# Patient Record
Sex: Female | Born: 1955
Health system: Southern US, Community
[De-identification: ages and names within clinical notes are randomized; demographics above are authoritative.]

## PROBLEM LIST (undated history)

## (undated) DIAGNOSIS — K922 Gastrointestinal hemorrhage, unspecified: Secondary | ICD-10-CM

## (undated) DIAGNOSIS — M5030 Other cervical disc degeneration, unspecified cervical region: Secondary | ICD-10-CM

## (undated) DIAGNOSIS — Q761 Klippel-Feil syndrome: Secondary | ICD-10-CM

## (undated) DIAGNOSIS — Q740 Other congenital malformations of upper limb(s), including shoulder girdle: Secondary | ICD-10-CM

## (undated) DIAGNOSIS — B029 Zoster without complications: Secondary | ICD-10-CM

## (undated) DIAGNOSIS — E559 Vitamin D deficiency, unspecified: Secondary | ICD-10-CM

## (undated) DIAGNOSIS — Z8669 Personal history of other diseases of the nervous system and sense organs: Secondary | ICD-10-CM

## (undated) DIAGNOSIS — D649 Anemia, unspecified: Secondary | ICD-10-CM

## (undated) DIAGNOSIS — N8502 Endometrial intraepithelial neoplasia [EIN]: Secondary | ICD-10-CM

## (undated) DIAGNOSIS — IMO0001 Reserved for inherently not codable concepts without codable children: Secondary | ICD-10-CM

## (undated) HISTORY — PX: WISDOM TOOTH EXTRACTION: SHX21

## (undated) HISTORY — DX: Personal history of other diseases of the nervous system and sense organs: Z86.69

## (undated) HISTORY — DX: Gastrointestinal hemorrhage, unspecified: K92.2

## (undated) HISTORY — DX: Other congenital malformations of upper limb(s), including shoulder girdle: Q74.0

## (undated) HISTORY — DX: Klippel-Feil syndrome: Q76.1

## (undated) HISTORY — PX: TONSILLECTOMY: SHX5217

## (undated) HISTORY — DX: Vitamin D deficiency, unspecified: E55.9

## (undated) HISTORY — DX: Endometrial intraepithelial neoplasia (EIN): N85.02

## (undated) HISTORY — DX: Reserved for inherently not codable concepts without codable children: IMO0001

## (undated) HISTORY — DX: Anemia, unspecified: D64.9

## (undated) HISTORY — DX: Zoster without complications: B02.9

## (undated) HISTORY — PX: DILATION AND CURETTAGE OF UTERUS: SHX78

## (undated) HISTORY — DX: Other cervical disc degeneration, unspecified cervical region: M50.30

---

## 1993-02-02 DIAGNOSIS — IMO0001 Reserved for inherently not codable concepts without codable children: Secondary | ICD-10-CM

## 1993-02-02 HISTORY — DX: Reserved for inherently not codable concepts without codable children: IMO0001

## 1998-07-09 ENCOUNTER — Other Ambulatory Visit: Admission: RE | Admit: 1998-07-09 | Discharge: 1998-07-09 | Payer: Self-pay | Admitting: Obstetrics and Gynecology

## 2001-05-12 ENCOUNTER — Other Ambulatory Visit: Admission: RE | Admit: 2001-05-12 | Discharge: 2001-05-12 | Payer: Self-pay | Admitting: Obstetrics and Gynecology

## 2001-06-08 ENCOUNTER — Ambulatory Visit (HOSPITAL_COMMUNITY): Admission: RE | Admit: 2001-06-08 | Discharge: 2001-06-08 | Payer: Self-pay | Admitting: Family Medicine

## 2004-02-03 HISTORY — PX: ABDOMINAL HYSTERECTOMY: SUR658

## 2004-03-17 ENCOUNTER — Ambulatory Visit (HOSPITAL_COMMUNITY): Admission: RE | Admit: 2004-03-17 | Discharge: 2004-03-17 | Payer: Self-pay | Admitting: Family Medicine

## 2004-05-15 ENCOUNTER — Ambulatory Visit (HOSPITAL_COMMUNITY): Admission: RE | Admit: 2004-05-15 | Discharge: 2004-05-15 | Payer: Self-pay | Admitting: Obstetrics and Gynecology

## 2004-07-30 ENCOUNTER — Observation Stay (HOSPITAL_COMMUNITY): Admission: RE | Admit: 2004-07-30 | Discharge: 2004-07-31 | Payer: Self-pay | Admitting: *Deleted

## 2010-12-18 ENCOUNTER — Other Ambulatory Visit: Payer: Self-pay | Admitting: Dermatology

## 2011-03-11 ENCOUNTER — Telehealth: Payer: Self-pay | Admitting: *Deleted

## 2011-03-11 ENCOUNTER — Other Ambulatory Visit: Payer: Self-pay | Admitting: Internal Medicine

## 2011-03-11 DIAGNOSIS — Z1211 Encounter for screening for malignant neoplasm of colon: Secondary | ICD-10-CM

## 2011-03-11 MED ORDER — PEG-KCL-NACL-NASULF-NA ASC-C 100 G PO SOLR: 1.0000 | Freq: Once | ORAL | Status: DC

## 2011-03-11 NOTE — Telephone Encounter (Signed)
Patient came with her mother today for an appointment and she has requested a colonoscopy. Dr Juanda Chance has okayed patient to have a direct recall colonoscopy. I have scheduled patient for colonoscopy and have gone over instructions with her for that.

## 2011-03-18 ENCOUNTER — Ambulatory Visit (AMBULATORY_SURGERY_CENTER): Payer: 59 | Admitting: Internal Medicine

## 2011-03-18 ENCOUNTER — Encounter: Payer: Self-pay | Admitting: Internal Medicine

## 2011-03-18 VITALS — BP 129/79 | HR 78 | Temp 99.1°F | Resp 18 | Ht 64.0 in | Wt 242.0 lb

## 2011-03-18 DIAGNOSIS — Z1211 Encounter for screening for malignant neoplasm of colon: Secondary | ICD-10-CM

## 2011-03-18 MED ORDER — SODIUM CHLORIDE 0.9 % IV SOLN: 500.0000 mL | INTRAVENOUS | Status: DC

## 2011-03-18 NOTE — Op Note (Signed)
[facility] [address] [place], [place]  [address]  COLONOSCOPY PROCEDURE REPORT  PATIENT:  Judith Salazar, Judith Salazar  MR#:  [id number] BIRTHDATE:  01-15-1956, 55 yrs. old  GENDER:  female ENDOSCOPIST:  Hedwig Morton. Juanda Chance, MD REF. BY:  Leda Quail, M.D. PROCEDURE DATE:  03/18/2011 PROCEDURE:  Colonoscopy 19147 ASA CLASS:  Class II INDICATIONS:  colorectal cancer screening, average risk colon 28 years ago for ? IBS MEDICATIONS:   MAC sedation, administered by CRNA, propofol (Diprivan)  DESCRIPTION OF PROCEDURE:   After the risks and benefits and of the procedure were explained, informed consent was obtained. Digital rectal exam was performed and revealed no rectal masses. The [id number] endoscope was introduced through the anus and advanced to the cecum, which was identified by both the appendix and ileocecal valve.  The quality of the prep was good, using MoviPrep.  The instrument was then slowly withdrawn as the colon was fully examined. <<PROCEDUREIMAGES>>  FINDINGS:  No polyps or cancers were seen (see image1, image2, image3, and image4).   Retroflexed views in the rectum revealed no abnormalities.    The scope was then withdrawn from the patient and the procedure completed.  COMPLICATIONS:  None ENDOSCOPIC IMPRESSION: 1) No polyps or cancers 2) Normal colonoscopy RECOMMENDATIONS: 1) High fiber diet.  REPEAT EXAM:  In 10 year(s) for.  ______________________________ Hedwig Morton. Juanda Chance, MD  CC:  n. eSIGNED:   Hedwig Morton. Bradely Rudin at 03/18/2011 12:26 PM  Doughtie, Fannie Knee, [phone]

## 2011-03-18 NOTE — Patient Instructions (Signed)
Please review discharge instructions (blue and green sheets)  Normal colonoscopy- follow up in 10 years  Resume your normal medications

## 2011-03-18 NOTE — Progress Notes (Signed)
Patient did not experience any of the following events: a burn prior to discharge; a fall within the facility; wrong site/side/patient/procedure/implant event; or a hospital transfer or hospital admission upon discharge from the facility. (G8907) Patient did not have preoperative order for IV antibiotic SSI prophylaxis. (G8918)  

## 2011-03-19 ENCOUNTER — Telehealth: Payer: Self-pay | Admitting: *Deleted

## 2011-03-19 NOTE — Telephone Encounter (Signed)
Left message on voicemail on f/u callback.

## 2013-04-09 ENCOUNTER — Other Ambulatory Visit: Payer: Self-pay | Admitting: Obstetrics & Gynecology

## 2013-04-10 NOTE — Telephone Encounter (Signed)
Last filled: AEX 04/06/12 x 1 year  AEX 06/23/12 with Dr. Hyacinth MeekerMiller Last Mammogram: 04/27/12 Bi-Rads 1 (Paper Chart) Estrace Cream #42.5 gm/0refills sent to last patient until AEX

## 2013-06-23 ENCOUNTER — Ambulatory Visit: Payer: Self-pay | Admitting: Obstetrics & Gynecology

## 2013-06-27 ENCOUNTER — Encounter: Payer: Self-pay | Admitting: Obstetrics & Gynecology

## 2013-06-28 ENCOUNTER — Encounter: Payer: Self-pay | Admitting: Obstetrics & Gynecology

## 2013-06-28 ENCOUNTER — Ambulatory Visit (INDEPENDENT_AMBULATORY_CARE_PROVIDER_SITE_OTHER): Payer: BC Managed Care – PPO | Admitting: Obstetrics & Gynecology

## 2013-06-28 VITALS — BP 102/62 | HR 68 | Resp 16 | Ht 62.75 in | Wt 224.2 lb

## 2013-06-28 DIAGNOSIS — Z01419 Encounter for gynecological examination (general) (routine) without abnormal findings: Secondary | ICD-10-CM

## 2013-06-28 DIAGNOSIS — Z124 Encounter for screening for malignant neoplasm of cervix: Secondary | ICD-10-CM

## 2013-06-28 MED ORDER — ESTRADIOL 0.1 MG/GM VA CREA: TOPICAL_CREAM | VAGINAL | Status: DC

## 2013-06-28 MED ORDER — VITAMIN D (ERGOCALCIFEROL) 1.25 MG (50000 UNIT) PO CAPS: 50000.0000 [IU] | ORAL_CAPSULE | ORAL | Status: DC

## 2013-06-28 NOTE — Progress Notes (Signed)
58 y.o. G1P1 UnknownCaucasianF here for annual exam.  Considering moving to [facility].  Very busy at work.  Going to [employer] in [place].  Daughter is doing well.    No vaginal bleeding.  Occasionally has some dryness but this is much better.  Patient's last menstrual period was 02/03/2004.          Sexually active: yes  The current method of family planning is status post hysterectomy.    Exercising: no  not regularly Smoker:  no  Health Maintenance: Pap:  03/11/11 WNL/negative HR HPV History of abnormal Pap:  no MMG:  04/27/12-normal, has appt next week at [facility] Colonoscopy:  3/13-repeat in 10 years, Dr. Juanda Chance BMD:   2013/14-normal TDaP:  2010 Screening Labs: PCP, Hb today: PCP, Urine today: PCP   reports that she has never smoked. She has never used smokeless tobacco. She reports that she drinks alcohol. She reports that she does not use illicit drugs.  Past Medical History  Diagnosis Date  . Hx of migraines   . Shingles   . Madelung's deformity     right wrist  . History of Clostridium difficile 1995    hospitalized x 3 days  . Vitamin D deficiency     Past Surgical History  Procedure Laterality Date  . Abdominal hysterectomy      supra cervix  . Tonsillectomy    . Cesarean section    . Wisdom tooth extraction    . Dilation and curettage of uterus      Current Outpatient Prescriptions  Medication Sig Dispense Refill  . Calcium Carbonate-Vitamin D (CALTRATE 600+D PO) Take by mouth daily.      Tery Sanfilippo Sodium (COLACE PO) Take by mouth as needed.      Marland Kitchen ESTRACE VAGINAL 0.1 MG/GM vaginal cream INSERT 1 GRAM VAGINALLY AT BEDTIME 2 TIMES A WEEK.  42.5 g  0  . Multiple Vitamin (MULTIVITAMIN) tablet Take 1 tablet by mouth daily.      . Vitamin D, Ergocalciferol, (DRISDOL) 50000 UNITS CAPS capsule Take 50,000 Units by mouth every 7 (seven) days.       No current facility-administered medications for this visit.    Family History  Problem Relation Age of  Onset  . Diabetes Maternal Uncle   . Lung cancer Maternal Uncle     mets-renal cell carcinoma  . Skin cancer Mother   . Pancreatic cancer Maternal Grandmother   . Prostate cancer Paternal Grandfather   . Hypertension Father   . Heart attack Father     deceased age 42  . Congestive Heart Failure Maternal Grandfather   . COPD Maternal Grandfather   . Congestive Heart Failure Maternal Uncle     CABG  . Thyroid disease Mother     graves disease-now hypothyroid-treatment with radiation iodine  . Dementia Mother     mild  . Osteoporosis Mother     ROS:  Pertinent items are noted in HPI.  Otherwise, a comprehensive ROS was negative.  Exam:   BP 102/62  Pulse 68  Resp 16  Ht 5' 2.75" (1.594 m)  Wt 224 lb 3.2 oz (101.696 kg)  BMI 40.02 kg/m2  LMP 02/03/2004  Weight change: -3#  Height: 5' 2.75" (159.4 cm)  Ht Readings from Last 3 Encounters:  06/28/13 5' 2.75" (1.594 m)  03/18/11 5\' 4"  (1.626 m)    General appearance: alert, cooperative and appears stated age Head: Normocephalic, without obvious abnormality, atraumatic Neck: no adenopathy, supple, symmetrical, trachea  midline and thyroid normal to inspection and palpation Lungs: clear to auscultation bilaterally Breasts: normal appearance, no masses or tenderness Heart: regular rate and rhythm Abdomen: soft, non-tender; bowel sounds normal; no masses,  no organomegaly Extremities: extremities normal, atraumatic, no cyanosis or edema Skin: Skin color, texture, turgor normal. No rashes or lesions Lymph nodes: Cervical, supraclavicular, and axillary nodes normal. No abnormal inguinal nodes palpated Neurologic: Grossly normal   Pelvic: External genitalia:  no lesions              Urethra:  normal appearing urethra with no masses, tenderness or lesions              Bartholins and Skenes: normal                 Vagina: normal appearing vagina with normal color and discharge, no lesions              Cervix: no lesions               Pap taken: yes Bimanual Exam:  Uterus:  uterus absent              Adnexa: normal adnexa               Rectovaginal: Confirms               Anus:  normal sphincter tone, no lesions  A:  Well Woman with normal exam H/O supracervical TAH with h/o complex endometrial hyperplasia with focal atypia Vaginal atrophic changes, much better with estrace cream  P:   Mammogram scheduled pap smear only today.  Neg HR HPV 2013. Vit D 50K weekly.  #30/2Rf to pharmacy Estrace vaginal cream 1 gram weekly twice to three times weekly return annually or prn  An After Visit Summary was printed and given to the patient.

## 2013-06-28 NOTE — Patient Instructions (Signed)

## 2013-07-03 LAB — IPS PAP SMEAR ONLY

## 2013-07-10 ENCOUNTER — Telehealth: Payer: Self-pay

## 2013-07-10 MED ORDER — FLUCONAZOLE 150 MG PO TABS: ORAL_TABLET | ORAL | Status: DC

## 2013-07-10 NOTE — Telephone Encounter (Signed)
See pap smear results-per Dr Hyacinth Meeker, if patient is having symptoms, ok to send RX//kn

## 2013-10-18 ENCOUNTER — Ambulatory Visit (INDEPENDENT_AMBULATORY_CARE_PROVIDER_SITE_OTHER): Payer: BC Managed Care – PPO | Admitting: Podiatry

## 2013-10-18 ENCOUNTER — Ambulatory Visit (INDEPENDENT_AMBULATORY_CARE_PROVIDER_SITE_OTHER): Payer: BC Managed Care – PPO

## 2013-10-18 ENCOUNTER — Encounter: Payer: Self-pay | Admitting: Podiatry

## 2013-10-18 VITALS — BP 145/72 | HR 72 | Resp 17

## 2013-10-18 DIAGNOSIS — M6740 Ganglion, unspecified site: Secondary | ICD-10-CM

## 2013-10-18 DIAGNOSIS — B351 Tinea unguium: Secondary | ICD-10-CM

## 2013-10-18 DIAGNOSIS — M2010 Hallux valgus (acquired), unspecified foot: Secondary | ICD-10-CM

## 2013-10-18 DIAGNOSIS — R52 Pain, unspecified: Secondary | ICD-10-CM

## 2013-10-18 MED ORDER — TRIAMCINOLONE ACETONIDE 10 MG/ML IJ SUSP
10.0000 mg | Freq: Once | INTRAMUSCULAR | Status: AC
Start: 2013-10-18 — End: 2013-10-18
  Administered 2013-10-18: 10 mg

## 2013-10-18 NOTE — Progress Notes (Signed)
   Subjective:    Patient ID: Judith Salazar, female    DOB: Jan 13, 1956, 58 y.o.   MRN: [id number]  HPI  Pt presents with painful cyst on top of her left foot, great toe area, states that it has worsened and grown since last visit, also wants treatment for bilateral great toe fungus  Review of Systems  All other systems reviewed and are negative.      Objective:   Physical Exam        Assessment & Plan:

## 2013-10-19 NOTE — Progress Notes (Signed)
Subjective:     Patient ID: Judith Salazar, female   DOB: 1955/12/03, 58 y.o.   MRN: [id number]  Foot Pain   patient states that this area around the first metatarsal head left has grown in size and become more red and it is becoming more aggravating for me. Also I'm having some discoloration of the big toenails of both feet that I would like to get rid of if possible   Review of Systems  All other systems reviewed and are negative.      Objective:   Physical Exam  Nursing note and vitals reviewed. Constitutional: She is oriented to person, place, and time.  Cardiovascular: Intact distal pulses.   Musculoskeletal: Normal range of motion.  Neurological: She is oriented to person, place, and time.  Skin: Skin is warm.   neurovascular status is intact with muscle strength adequate and range of motion subtalar midtarsal joint within normal limits. Digits are well perfused arch height is mildly diminished on weightbearing and she is well oriented x3. I noted there to be inflammation with slight fluid buildup around the first metatarsal head left but I did note a sharp point on the bone on the medial dorsal eminence and I also noted discoloration of the big toenails of both feet in the distal one third of the nailbed     Assessment:     Structural hyperostosis around the first metatarsal head left with possibility for either ganglionic cyst or inflamed capsule. Mycotic nail infection of both big toenails    Plan:     H&P and x-rays reviewed. Today I went ahead and I did a proximal nerve block attempted aspiration which was not successful and injected a small amount of steroid around the first MPJ left. This crusted ultimately this may require a very simple surgery to fix and also we discussed her nails and she will have laser on her to big toenails in the near future

## 2013-11-01 ENCOUNTER — Ambulatory Visit: Payer: BC Managed Care – PPO | Admitting: Podiatry

## 2013-11-29 ENCOUNTER — Ambulatory Visit: Payer: BC Managed Care – PPO | Admitting: Podiatry

## 2013-11-29 DIAGNOSIS — B351 Tinea unguium: Secondary | ICD-10-CM

## 2013-11-30 NOTE — Progress Notes (Signed)
Subjective:     Patient ID: Judith Salazar, female   DOB: 1956-01-25, 58 y.o.   MRN: [id number]  HPI patient presents for laser of both big toenails today   Review of Systems     Objective:   Physical Exam Neurovascular status intact with distal thick yellow nails bilateral hallux    Assessment:     Mycotic infection of distal hallux of both feet    Plan:     Laser approximately 1200 pulses was obtained today and tolerated well

## 2013-12-04 ENCOUNTER — Encounter: Payer: Self-pay | Admitting: Podiatry

## 2014-01-10 ENCOUNTER — Ambulatory Visit: Payer: BC Managed Care – PPO | Admitting: Podiatry

## 2014-02-07 ENCOUNTER — Encounter: Payer: Self-pay | Admitting: Podiatry

## 2014-02-07 ENCOUNTER — Ambulatory Visit (INDEPENDENT_AMBULATORY_CARE_PROVIDER_SITE_OTHER): Payer: BLUE CROSS/BLUE SHIELD | Admitting: Podiatry

## 2014-02-07 DIAGNOSIS — B351 Tinea unguium: Secondary | ICD-10-CM

## 2014-02-07 NOTE — Progress Notes (Signed)
Subjective:     Patient ID: Judith Salazar, female   DOB: November 08, 1955, 59 y.o.   MRN: [id number]  HPI patient presents with nail disease hallux bilateral that is improving with laser   Review of Systems     Objective:   Physical Exam Neurovascular status intact with slight yellow discoloration distal one third nailbeds hallux bilateral    Assessment:     Mycotic infection still present but improved    Plan:     Laser applied approximately 1200 pulses to each hallux tolerated well and reappoint in 4 months and also begin formula 3

## 2014-06-13 ENCOUNTER — Ambulatory Visit (INDEPENDENT_AMBULATORY_CARE_PROVIDER_SITE_OTHER): Payer: BLUE CROSS/BLUE SHIELD | Admitting: Podiatry

## 2014-06-13 DIAGNOSIS — B351 Tinea unguium: Secondary | ICD-10-CM

## 2014-06-15 NOTE — Progress Notes (Signed)
Subjective:     Patient ID: Judith Salazar, female   DOB: 1955/06/07, 59 y.o.   MRN: [id number]  HPI patient presents for laser in of nails   Review of Systems     Objective:   Physical Exam Neurovascular status intact with improvement of nailbeds of both feet    Assessment:     Debride nailbeds and then went ahead and utilized laser 1000 1200 pulses    Plan:     Tolerated well

## 2014-07-10 ENCOUNTER — Ambulatory Visit: Payer: Self-pay | Admitting: Obstetrics & Gynecology

## 2014-10-17 ENCOUNTER — Ambulatory Visit (INDEPENDENT_AMBULATORY_CARE_PROVIDER_SITE_OTHER): Payer: BLUE CROSS/BLUE SHIELD | Admitting: Podiatry

## 2014-10-17 DIAGNOSIS — B351 Tinea unguium: Secondary | ICD-10-CM

## 2014-10-17 NOTE — Progress Notes (Signed)
Subjective:     Patient ID: Judith Salazar, female   DOB: Feb 15, 1955, 59 y.o.   MRN: [id number]  HPI patient states I developed some discoloration of my nail secondary to using the polish and my big toenails need laser   Review of Systems     Objective:   Physical Exam Neurovascular status intact with some discoloration of the hallux and lesser nails bilateral    Assessment:     Mycotic nail with reaction to chemical of the medication    Plan:     Stop medication and utilized laser therapy at this time approximate 1500 pulses to the hallux bilateral which was tolerated well

## 2014-11-30 ENCOUNTER — Ambulatory Visit: Payer: Self-pay | Admitting: Obstetrics & Gynecology

## 2015-01-16 ENCOUNTER — Other Ambulatory Visit: Payer: BLUE CROSS/BLUE SHIELD

## 2015-01-16 ENCOUNTER — Ambulatory Visit: Payer: BLUE CROSS/BLUE SHIELD | Admitting: Podiatry

## 2015-02-06 ENCOUNTER — Other Ambulatory Visit: Payer: BLUE CROSS/BLUE SHIELD

## 2015-02-13 ENCOUNTER — Encounter: Payer: Self-pay | Admitting: Podiatry

## 2015-02-13 ENCOUNTER — Ambulatory Visit (INDEPENDENT_AMBULATORY_CARE_PROVIDER_SITE_OTHER): Payer: BLUE CROSS/BLUE SHIELD | Admitting: Podiatry

## 2015-02-13 DIAGNOSIS — B351 Tinea unguium: Secondary | ICD-10-CM

## 2015-02-15 NOTE — Progress Notes (Signed)
Subjective:     Patient ID: Judith Salazar, female   DOB: 12/08/1955, 60 y.o.   MRN: [id number]  HPI patient presents with nail yellow numbness and a small lesion left   Review of Systems     Objective:   Physical Exam Neurovascular status intact mycotic infection left nail improved but present with lesion plantar left    Assessment:     Lesion nail disease    Plan:     Laser administered debridement and nail and reappoint as needed

## 2015-02-22 ENCOUNTER — Ambulatory Visit (INDEPENDENT_AMBULATORY_CARE_PROVIDER_SITE_OTHER): Payer: BLUE CROSS/BLUE SHIELD | Admitting: Obstetrics & Gynecology

## 2015-02-22 ENCOUNTER — Encounter: Payer: Self-pay | Admitting: Obstetrics & Gynecology

## 2015-02-22 VITALS — BP 108/80 | HR 66 | Resp 16 | Ht 63.0 in | Wt 240.0 lb

## 2015-02-22 DIAGNOSIS — Z01419 Encounter for gynecological examination (general) (routine) without abnormal findings: Secondary | ICD-10-CM | POA: Diagnosis not present

## 2015-02-22 DIAGNOSIS — E559 Vitamin D deficiency, unspecified: Secondary | ICD-10-CM | POA: Diagnosis not present

## 2015-02-22 MED ORDER — VITAMIN D (ERGOCALCIFEROL) 1.25 MG (50000 UNIT) PO CAPS: 50000.0000 [IU] | ORAL_CAPSULE | ORAL | Status: DC

## 2015-02-22 MED ORDER — ESTRADIOL 0.1 MG/GM VA CREA: TOPICAL_CREAM | VAGINAL | Status: DC

## 2015-02-22 NOTE — Progress Notes (Signed)
60 y.o. G1P1 UnknownCaucasianF here for annual exam.  Doing well.  No vaginal bleeding.  Reports some stressors with her daughter due to increased intravascular pressure. She has been diagnosed with idiopathic intracranial hypertension.  She is having an MRI/MRA.  Then she will have a lumbar puncture.    PCP:  Dr. Evlyn Kanner.  Labs have been done this past years.   Patient's last menstrual period was 02/03/2004.          Sexually active: Yes.    The current method of family planning is status post hysterectomy.    Exercising: Yes.    Treadmill 3 x week 20 min Smoker:  no  Health Maintenance: Pap:  06/28/13 Neg. 03/2011 Neg HR HPV History of abnormal Pap:  no MMG: 08/15/14 BIRADS1:neg Colonoscopy:  03/18/11 Normal - repeat 10 years  BMD:   07/08/11  TDaP:  2010 Screening Labs: PCP, Hb today: PCP, Urine today: PCP   reports that she has never smoked. She has never used smokeless tobacco. She reports that she drinks alcohol. She reports that she does not use illicit drugs.  Past Medical History  Diagnosis Date  . Hx of migraines   . Shingles 3/10, 11/16  . Madelung's deformity     right wrist  . History of Clostridium difficile 1995    hospitalized x 3 days  . Vitamin D deficiency     Past Surgical History  Procedure Laterality Date  . Abdominal hysterectomy  2006    supra cervix  . Tonsillectomy    . Cesarean section    . Wisdom tooth extraction    . Dilation and curettage of uterus      Current Outpatient Prescriptions  Medication Sig Dispense Refill  . Calcium Carbonate-Vitamin D (CALTRATE 600+D PO) Take by mouth daily.    Tery Sanfilippo Sodium (COLACE PO) Take by mouth as needed.    Marland Kitchen estradiol (ESTRACE VAGINAL) 0.1 MG/GM vaginal cream 1 gram vaginally twice weekly 42.5 g 3  . Multiple Vitamin (MULTIVITAMIN) tablet Take 1 tablet by mouth daily.    . Vitamin D, Ergocalciferol, (DRISDOL) 50000 UNITS CAPS capsule Take 1 capsule (50,000 Units total) by mouth every 7 (seven) days. 30  capsule 2   No current facility-administered medications for this visit.    Family History  Problem Relation Age of Onset  . Diabetes Maternal Uncle   . Lung cancer Maternal Uncle     mets-renal cell carcinoma  . Skin cancer Mother   . Pancreatic cancer Maternal Grandmother   . Prostate cancer Paternal Grandfather   . Hypertension Father   . Heart attack Father     deceased age 75  . Congestive Heart Failure Maternal Grandfather   . COPD Maternal Grandfather   . Congestive Heart Failure Maternal Uncle     CABG  . Thyroid disease Mother     graves disease-now hypothyroid-treatment with radiation iodine  . Dementia Mother     mild  . Osteoporosis Mother     ROS:  Pertinent items are noted in HPI.  Otherwise, a comprehensive ROS was negative.  Exam:   BP 108/80 mmHg  Pulse 66  Resp 16  Ht  (1.6 m)  Wt 240 lb (108.863 kg)  BMI 42.52 kg/m2  LMP 02/03/2004  Weight change: -4#  Height:  (160 cm)  Ht Readings from Last 3 Encounters:  02/22/15  (1.6 m)  06/28/13 5' 2.75" (1.594 m)  03/18/11  (1.626 m)    General  appearance: alert, cooperative and appears stated age Head: Normocephalic, without obvious abnormality, atraumatic Neck: no adenopathy, supple, symmetrical, trachea midline and thyroid normal to inspection and palpation Lungs: clear to auscultation bilaterally Breasts: normal appearance, no masses or tenderness Heart: regular rate and rhythm Abdomen: soft, non-tender; bowel sounds normal; no masses,  no organomegaly Extremities: extremities normal, atraumatic, no cyanosis or edema Skin: Skin color, texture, turgor normal. No rashes or lesions Lymph nodes: Cervical, supraclavicular, and axillary nodes normal. No abnormal inguinal nodes palpated Neurologic: Grossly normal   Pelvic: External genitalia:  no lesions              Urethra:  normal appearing urethra with no masses, tenderness or lesions              Bartholins and Skenes: normal                  Vagina: normal appearing vagina with normal color and discharge, no lesions              Cervix: absent              Pap taken: No. Bimanual Exam:  Uterus:  uterus absent              Adnexa: no mass, fullness, tenderness               Rectovaginal: Confirms               Anus:  normal sphincter tone, no lesions  Chaperone was present for exam.  A:  Well Woman with normal exam H/O supracervical TAH with h/o complex endometrial hyperplasia with focal atypia Vaginal atrophic changes, much better with estrace cream  P: Mammogram scheduled No pap today.  Neg pap 2015.  Neg HR HPV 2013. Vit D 50K weekly. #30/2RF to pharmacy Estrace vaginal cream 1 gram weekly twice to three times weekly TSH, Vit D, lipid, CMP, CBC, Hep C Ab--order given to pt return annually or prn

## 2015-02-26 ENCOUNTER — Ambulatory Visit: Payer: Self-pay | Admitting: Obstetrics & Gynecology

## 2015-03-12 ENCOUNTER — Ambulatory Visit: Payer: Self-pay | Admitting: Obstetrics & Gynecology

## 2015-05-24 DIAGNOSIS — R300 Dysuria: Secondary | ICD-10-CM | POA: Diagnosis not present

## 2015-07-31 DIAGNOSIS — D225 Melanocytic nevi of trunk: Secondary | ICD-10-CM | POA: Diagnosis not present

## 2015-07-31 DIAGNOSIS — L821 Other seborrheic keratosis: Secondary | ICD-10-CM | POA: Diagnosis not present

## 2015-07-31 DIAGNOSIS — L814 Other melanin hyperpigmentation: Secondary | ICD-10-CM | POA: Diagnosis not present

## 2015-07-31 DIAGNOSIS — L819 Disorder of pigmentation, unspecified: Secondary | ICD-10-CM | POA: Diagnosis not present

## 2015-08-21 DIAGNOSIS — Z1231 Encounter for screening mammogram for malignant neoplasm of breast: Secondary | ICD-10-CM | POA: Diagnosis not present

## 2015-09-04 DIAGNOSIS — H524 Presbyopia: Secondary | ICD-10-CM | POA: Diagnosis not present

## 2015-09-05 ENCOUNTER — Encounter: Payer: Self-pay | Admitting: Obstetrics & Gynecology

## 2015-10-10 DIAGNOSIS — Z23 Encounter for immunization: Secondary | ICD-10-CM | POA: Diagnosis not present

## 2016-01-21 DIAGNOSIS — K922 Gastrointestinal hemorrhage, unspecified: Secondary | ICD-10-CM

## 2016-01-21 HISTORY — DX: Gastrointestinal hemorrhage, unspecified: K92.2

## 2016-01-28 DIAGNOSIS — K921 Melena: Secondary | ICD-10-CM | POA: Diagnosis not present

## 2016-02-05 ENCOUNTER — Telehealth: Payer: Self-pay | Admitting: Internal Medicine

## 2016-02-05 NOTE — Telephone Encounter (Signed)
Returned [employer] [employer]'s call and scheduled with Dr. Marina GoodellPerry for 03-13-16.  She said she will notify patient of appt

## 2016-02-05 NOTE — Telephone Encounter (Signed)
I am happy to see her. Thanks

## 2016-02-17 NOTE — Telephone Encounter (Signed)
Pt is scheduled on 03-13-16

## 2016-02-21 DIAGNOSIS — K921 Melena: Secondary | ICD-10-CM | POA: Diagnosis not present

## 2016-03-13 ENCOUNTER — Encounter: Payer: Self-pay | Admitting: Internal Medicine

## 2016-03-13 ENCOUNTER — Ambulatory Visit (INDEPENDENT_AMBULATORY_CARE_PROVIDER_SITE_OTHER): Payer: BLUE CROSS/BLUE SHIELD | Admitting: Internal Medicine

## 2016-03-13 VITALS — BP 124/72 | HR 82 | Ht 63.0 in | Wt 253.0 lb

## 2016-03-13 DIAGNOSIS — D509 Iron deficiency anemia, unspecified: Secondary | ICD-10-CM | POA: Diagnosis not present

## 2016-03-13 DIAGNOSIS — K920 Hematemesis: Secondary | ICD-10-CM

## 2016-03-13 DIAGNOSIS — K921 Melena: Secondary | ICD-10-CM | POA: Diagnosis not present

## 2016-03-13 MED ORDER — NA SULFATE-K SULFATE-MG SULF 17.5-3.13-1.6 GM/177ML PO SOLN: 1.0000 | Freq: Once | ORAL | 0 refills | Status: AC

## 2016-03-13 NOTE — Patient Instructions (Signed)

## 2016-03-13 NOTE — Progress Notes (Signed)
HISTORY OF PRESENT ILLNESS:  Judith Salazar is a pleasant 61 y.o. 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 Evlyn Kanner with chief complaints of hematemesis, melena, and anemia. The patient's GI history is remarkable for problems with post antibiotic Clostridium difficile 1996 and undefined upper abdominal pain 2016. Evaluations of the abdominal pain in late July 2016 included normal right upper quadrant ultrasound, contrast-enhanced CT scan of the abdomen and pelvis, and laboratories including comprehensive metabolic panel, CBC, amylase, and lipase. Hemoglobin at that time was 13.2. She tells me that she was placed on PPI for approximate 4 weeks and her symptoms resolved without recurrence. She remained in her usual state of health until late December 2017 when while visiting [place] with her family (to celebrate her 60th birthday) developed a presyncopal spell while bending over. This was followed later that evening by recurrent nausea and eventual coffee-ground emesis. Over the next 8 days she noticed dark tarry stools consistent with melena. She did not seek immediate medical attention. Repeat blood counts in [facility] 01/28/2016 showed interval anemia with a hemoglobin of 10.4. Normal MCV 87.8. Iron studies revealed iron deficiency with a saturation of 12% and ferritin of 9.7. B12 was borderline at 278 (232). She was placed on daily PPI, B12, and iron. Follow-up hemoglobin 02/21/2016 was 11.4. She completed 1 month of PPI approximately 1 week ago. She has had no problems or recurrent symptoms since the first of this year. She did have routine screening colonoscopy with Dr. Juanda Chance in February 2013. Examination was normal. Routine follow-up colonoscopy in 10 years was anticipated. GI review of systems is otherwise negative. She does not donate blood and is status post remote hysterectomy  REVIEW OF SYSTEMS:  All non-GI ROS negative upon comprehensive review  Past  Medical History:  Diagnosis Date  . Anemia   . History of Clostridium difficile 1995   hospitalized x 3 days  . Hx of migraines   . Madelung's deformity    right wrist  . Shingles 3/10, 11/16  . Vitamin D deficiency     Past Surgical History:  Procedure Laterality Date  . ABDOMINAL HYSTERECTOMY  2006   supra cervix  . CESAREAN SECTION    . DILATION AND CURETTAGE OF UTERUS    . TONSILLECTOMY    . WISDOM TOOTH EXTRACTION      Social History Judith Salazar  reports that she has never smoked. She has never used smokeless tobacco. She reports that she drinks alcohol. She reports that she does not use drugs.  family history includes COPD in her maternal grandfather; Congestive Heart Failure in her maternal grandfather and maternal uncle; Dementia in her mother; Diabetes in her maternal uncle; Heart attack in her father; Hypertension in her father; Lung cancer in her maternal uncle; Osteoporosis in her mother; Pancreatic cancer in her maternal grandmother; Prostate cancer in her paternal grandfather; Skin cancer in her mother; Thyroid disease in her mother.  No Known Allergies     PHYSICAL EXAMINATION: Vital signs: BP 124/72   Pulse 82   Ht 5\' 3"  (1.6 m)   Wt 253 lb (114.8 kg)   LMP 02/03/2004 Comment: supracx  BMI 44.82 kg/m   Constitutional: Pleasant, generally well-appearing, no acute distress Psychiatric: alert and oriented x3, cooperative Eyes: extraocular movements intact, anicteric, conjunctiva pink Mouth: oral pharynx moist, no lesions Neck: supple no lymphadenopathy Cardiovascular: heart regular rate and rhythm, no murmur Lungs: clear to auscultation bilaterally Abdomen: soft, nontender, nondistended, no  obvious ascites, no peritoneal signs, normal bowel sounds, no organomegaly Rectal: Deferred until colonoscopy Extremities: no clubbing cyanosis or lower extremity edema bilaterally Skin: no lesions on visible extremities Neuro: No focal deficits.    ASSESSMENT:  #1. Upper GI bleed. Possible etiologies include peptic ulcer disease, Mallory-Weiss tear, vascular malformations, Dieulafoy lesion. No recurrent problems after the initial episode. #2. Normocytic anemia. Definite iron deficiency. Borderline B12 deficiency. Improved hemoglobin on iron and B12 #3. Prior history of upper abdominal pain as described. Negative workup. Improvement after course of PPI. #4. History of severe Clostridium difficile infection post antibiotic therapy, remotely. Required hospitalization. #5. Normal colonoscopy 5 years ago  PLAN:  #1. Upper endoscopy to evaluate upper GI bleeding. Also, duodenal biopsies to rule out sprue if endoscopic survey negative for mucosal abnormalities.The nature of the procedure, as well as the risks, benefits, and alternatives were carefully and thoroughly reviewed with the patient. Ample time for discussion and questions allowed. The patient understood, was satisfied, and agreed to proceed. #2. As it has been 5 years, would also plan colonoscopy given iron deficiency, which would not be expected with acute GI bleeding, to rule interval occult colonic lesion.The nature of the procedure, as well as the risks, benefits, and alternatives were carefully and thoroughly reviewed with the patient. Ample time for discussion and questions allowed. The patient understood, was satisfied, and agreed to proceed. #3. Resume PPI empirically. She agrees #4. Continue iron and B12, but hold iron 1 week prior colonoscopy to assist with preparation. Advised #5. If no pathology found on endoscopic examinations, would recommend monitoring her blood counts on iron and B12. This can be done by Dr. Evlyn [place]. If her counts normalize then I would continue B12 and stop iron. If she were to develop any evidence of recurrent GI bleeding and/or recurrent anemia off iron, then I would recommend capsule endoscopy to rule out small bowel lesion. We discussed this today.  A  copy of this consultation note has been sent to Dr. Evlyn [place]anner[place]

## 2016-05-27 ENCOUNTER — Encounter: Payer: BLUE CROSS/BLUE SHIELD | Admitting: Internal Medicine

## 2016-06-11 NOTE — Progress Notes (Signed)
61 y.o. G1P1 MarriedCaucasianF here for annual exam.  Doing well.  Daughter has moved back to [place].  She will be home this weekend.  Biggest issue was a GI bleed in December when [place] was in [place] a [place] trip.  Had coffee ground emesis and dark stools.  This lasted for five days.  Endoscopy and colonoscopy has been scheduled.  Has been doing hemoglobin levels at work.  This has been gradually improving.  On Dexilant not.  Last hemoglobin was 11.7.    Patient's last menstrual period was 02/03/2004.          Sexually active: Yes.    The current method of family planning is status post hysterectomy.    Exercising: No.  The patient does not participate in regular exercise at present. Smoker:  no  Health Maintenance: Pap:  06/28/13 Neg   03/2011 Neg. HR HPV:neg  History of abnormal Pap:  no MMG:  08/21/15 BIRADS1:neg  Colonoscopy:  03/18/11 Normal. f/u 10 years. Endo and colonoscopy scheduled for 08/2016 due to GI Bleed.   BMD:   07/08/11  TDaP:  2010  Pneumonia vaccine(s):  No Zostavax:   No.  Has had shingles twice. Hep C testing: will plan with lab work today Screening Labs: PCP   reports that she has never smoked. She has never used smokeless tobacco. She reports that she drinks alcohol. She reports that she does not use drugs.  Past Medical History:  Diagnosis Date  . Anemia   . GI bleed 01/21/2016  . History of Clostridium difficile 1995   hospitalized x 3 days  . Hx of migraines   . Madelung's deformity    right wrist  . Shingles 3/10, 11/16  . Vitamin D deficiency     Past Surgical History:  Procedure Laterality Date  . ABDOMINAL HYSTERECTOMY  2006   supra cervix  . CESAREAN SECTION    . DILATION AND CURETTAGE OF UTERUS    . TONSILLECTOMY    . WISDOM TOOTH EXTRACTION      Current Outpatient Prescriptions  Medication Sig Dispense Refill  . Calcium Carbonate-Vitamin D (CALTRATE 600+D PO) Take by mouth daily.    Marland Kitchen. Dexlansoprazole (DEXILANT) 30 MG capsule Take 30 mg  by mouth daily.    Marland Kitchen. estradiol (ESTRACE VAGINAL) 0.1 MG/GM vaginal cream 1 gram vaginally twice weekly 42.5 g 3  . Fe Cbn-Fe Gluc-FA-B12-C-DSS (FERRALET 90 PO) Take 1 tablet by mouth daily.    . Multiple Vitamin (MULTIVITAMIN) tablet Take 1 tablet by mouth daily.    . vitamin B-12 (CYANOCOBALAMIN) 1000 MCG tablet Take 1,000 mcg by mouth daily.    . Vitamin D, Ergocalciferol, (DRISDOL) 50000 units CAPS capsule Take 1 capsule (50,000 Units total) by mouth every 7 (seven) days. 12 capsule 4   No current facility-administered medications for this visit.     Family History  Problem Relation Age of Onset  . Diabetes Maternal Uncle   . Lung cancer Maternal Uncle        mets-renal cell carcinoma  . Skin cancer Mother   . Thyroid disease Mother        graves disease-now hypothyroid-treatment with radiation iodine  . Dementia Mother        mild  . Osteoporosis Mother   . Pancreatic cancer Maternal Grandmother   . Prostate cancer Paternal Grandfather   . Hypertension Father   . Heart attack Father        deceased age 61  . Congestive Heart  Failure Maternal Grandfather   . COPD Maternal Grandfather   . Congestive Heart Failure Maternal Uncle        CABG    ROS:  Pertinent items are noted in HPI.  Otherwise, a comprehensive ROS was negative.  Exam:   BP 130/82 (BP Location: Left Arm, Patient Position: Sitting, Cuff Size: Large)   Pulse 64   Resp 16   Ht 5' 2.5" (1.588 m)   Wt 254 lb (115.2 kg)   LMP 02/03/2004 Comment: supracx  BMI 45.72 kg/m   Weight change: +14#  Height: 5' 2.5" (158.8 cm)  Ht Readings from Last 3 Encounters:  06/12/16 5' 2.5" (1.588 m)  03/13/16 5\' 3"  (1.6 m)  02/22/15 5\' 3"  (1.6 m)   General appearance: alert, cooperative and appears stated age Head: Normocephalic, without obvious abnormality, atraumatic Neck: no adenopathy, supple, symmetrical, trachea midline and thyroid normal to inspection and palpation Lungs: clear to auscultation bilaterally Breasts:  normal appearance, no masses or tenderness Heart: regular rate and rhythm Abdomen: soft, non-tender; bowel sounds normal; no masses,  no organomegaly Extremities: extremities normal, atraumatic, no cyanosis or edema Skin: Skin color, texture, turgor normal. No rashes or lesions Lymph nodes: Cervical, supraclavicular, and axillary nodes normal. No abnormal inguinal nodes palpated Neurologic: Grossly normal   Pelvic: External genitalia:  no lesions              Urethra:  normal appearing urethra with no masses, tenderness or lesions              Bartholins and Skenes: normal                 Vagina: normal appearing vagina with normal color and discharge, no lesions              Cervix: absent              Pap taken: Yes.   Bimanual Exam:  Uterus:  normal size, contour, position, consistency, mobility, non-tender              Adnexa: normal adnexa and no mass, fullness, tenderness               Rectovaginal: Confirms               Anus:  normal sphincter tone, no lesions  Chaperone was present for exam.  A:  Well Woman with normal exam H/O supracervical TAH with h/o complex endomerial hyperplasia with focal atypia Vaginal atrophic changes much improved with estrace cream Recent GI bleed with associated anemia  P:   Mammogram guidelines reviewed pap smear and HR HPV obtained today Rx for Vit D 50K weekly .  #12/4RF Estrace vaginal cream 1gm pv twice weekly.  Rx to pharmacy. Rx for lab work given to pt:  Hep C antibody, CBC, Vit D Will plan to have dexa scan this year Return annually or prn

## 2016-06-12 ENCOUNTER — Encounter: Payer: Self-pay | Admitting: Obstetrics & Gynecology

## 2016-06-12 ENCOUNTER — Other Ambulatory Visit (HOSPITAL_COMMUNITY)
Admission: RE | Admit: 2016-06-12 | Discharge: 2016-06-12 | Disposition: A | Discharge status: Home / Self Care | Payer: BLUE CROSS/BLUE SHIELD | Source: Ambulatory Visit | Attending: Obstetrics & Gynecology | Admitting: Obstetrics & Gynecology

## 2016-06-12 ENCOUNTER — Ambulatory Visit (INDEPENDENT_AMBULATORY_CARE_PROVIDER_SITE_OTHER): Payer: BLUE CROSS/BLUE SHIELD | Admitting: Obstetrics & Gynecology

## 2016-06-12 VITALS — BP 130/82 | HR 64 | Resp 16 | Ht 62.5 in | Wt 254.0 lb

## 2016-06-12 DIAGNOSIS — E559 Vitamin D deficiency, unspecified: Secondary | ICD-10-CM | POA: Diagnosis not present

## 2016-06-12 DIAGNOSIS — Z01419 Encounter for gynecological examination (general) (routine) without abnormal findings: Secondary | ICD-10-CM | POA: Diagnosis not present

## 2016-06-12 DIAGNOSIS — Z124 Encounter for screening for malignant neoplasm of cervix: Secondary | ICD-10-CM

## 2016-06-12 DIAGNOSIS — K922 Gastrointestinal hemorrhage, unspecified: Secondary | ICD-10-CM | POA: Diagnosis not present

## 2016-06-12 DIAGNOSIS — Z205 Contact with and (suspected) exposure to viral hepatitis: Secondary | ICD-10-CM | POA: Diagnosis not present

## 2016-06-12 LAB — HM HEPATITIS C SCREENING LAB: HM Hepatitis Screen: NEGATIVE

## 2016-06-12 MED ORDER — ESTRADIOL 0.1 MG/GM VA CREA: TOPICAL_CREAM | VAGINAL | 3 refills | Status: DC

## 2016-06-12 MED ORDER — VITAMIN D (ERGOCALCIFEROL) 1.25 MG (50000 UNIT) PO CAPS: 50000.0000 [IU] | ORAL_CAPSULE | ORAL | 4 refills | Status: DC

## 2016-06-16 LAB — CYTOLOGY - PAP
Diagnosis: NEGATIVE
HPV: NOT DETECTED

## 2016-09-02 DIAGNOSIS — Z1231 Encounter for screening mammogram for malignant neoplasm of breast: Secondary | ICD-10-CM | POA: Diagnosis not present

## 2016-09-09 DIAGNOSIS — H01021 Squamous blepharitis right upper eyelid: Secondary | ICD-10-CM | POA: Diagnosis not present

## 2016-09-09 DIAGNOSIS — H04123 Dry eye syndrome of bilateral lacrimal glands: Secondary | ICD-10-CM | POA: Diagnosis not present

## 2016-09-09 DIAGNOSIS — D2262 Melanocytic nevi of left upper limb, including shoulder: Secondary | ICD-10-CM | POA: Diagnosis not present

## 2016-09-09 DIAGNOSIS — H01022 Squamous blepharitis right lower eyelid: Secondary | ICD-10-CM | POA: Diagnosis not present

## 2016-09-09 DIAGNOSIS — H47323 Drusen of optic disc, bilateral: Secondary | ICD-10-CM | POA: Diagnosis not present

## 2016-09-09 DIAGNOSIS — D2261 Melanocytic nevi of right upper limb, including shoulder: Secondary | ICD-10-CM | POA: Diagnosis not present

## 2016-09-09 DIAGNOSIS — L720 Epidermal cyst: Secondary | ICD-10-CM | POA: Diagnosis not present

## 2016-09-09 DIAGNOSIS — D1801 Hemangioma of skin and subcutaneous tissue: Secondary | ICD-10-CM | POA: Diagnosis not present

## 2016-10-19 ENCOUNTER — Other Ambulatory Visit: Payer: Self-pay | Admitting: Endocrinology

## 2016-10-19 DIAGNOSIS — M5010 Cervical disc disorder with radiculopathy, unspecified cervical region: Secondary | ICD-10-CM

## 2016-10-21 ENCOUNTER — Ambulatory Visit
Admission: RE | Admit: 2016-10-21 | Discharge: 2016-10-21 | Disposition: A | Discharge status: Home / Self Care | Payer: BLUE CROSS/BLUE SHIELD | Source: Ambulatory Visit | Attending: Endocrinology | Admitting: Endocrinology

## 2016-10-21 DIAGNOSIS — M5010 Cervical disc disorder with radiculopathy, unspecified cervical region: Secondary | ICD-10-CM

## 2016-10-21 DIAGNOSIS — M50222 Other cervical disc displacement at C5-C6 level: Secondary | ICD-10-CM | POA: Diagnosis not present

## 2016-10-28 ENCOUNTER — Other Ambulatory Visit: Payer: BLUE CROSS/BLUE SHIELD

## 2016-11-02 ENCOUNTER — Encounter: Payer: Self-pay | Admitting: Obstetrics & Gynecology

## 2016-11-04 DIAGNOSIS — Z23 Encounter for immunization: Secondary | ICD-10-CM | POA: Diagnosis not present

## 2016-11-30 DIAGNOSIS — H5213 Myopia, bilateral: Secondary | ICD-10-CM | POA: Diagnosis not present

## 2017-01-21 ENCOUNTER — Ambulatory Visit (INDEPENDENT_AMBULATORY_CARE_PROVIDER_SITE_OTHER): Payer: BLUE CROSS/BLUE SHIELD | Admitting: Podiatry

## 2017-01-21 ENCOUNTER — Encounter: Payer: Self-pay | Admitting: Podiatry

## 2017-01-21 DIAGNOSIS — L600 Ingrowing nail: Secondary | ICD-10-CM | POA: Diagnosis not present

## 2017-01-21 DIAGNOSIS — B351 Tinea unguium: Secondary | ICD-10-CM

## 2017-01-21 NOTE — Progress Notes (Signed)
Subjective:   Patient ID: Judith LarsenSue E Salazar, female   DOB: 61 y.o.   MRN: [id number]   HPI Patient presents stating she damaged her left big toenail and it has been sore loose and has been discolored and it occurred several months ago   ROS      Objective:  Physical Exam  Neurovascular status unchanged with patient found to have a damaged left hallux nail was loose with bloody infiltrate plantar with no proximal edema erythema noted     Assessment:  Damaged left hallux nail that is dystrophic painful when palpated     Plan:  Reviewed condition and recommended nail removal flushing the wound and applying sterile dressing.  This was accomplished after the toe was anesthetized 60 mg like Marcaine mixture and patient tolerated the procedure well

## 2017-01-21 NOTE — Patient Instructions (Signed)

## 2017-01-22 ENCOUNTER — Ambulatory Visit: Payer: BLUE CROSS/BLUE SHIELD | Admitting: Podiatry

## 2017-02-25 DIAGNOSIS — J329 Chronic sinusitis, unspecified: Secondary | ICD-10-CM | POA: Diagnosis not present

## 2017-04-20 DIAGNOSIS — M7918 Myalgia, other site: Secondary | ICD-10-CM | POA: Diagnosis not present

## 2017-04-20 DIAGNOSIS — E559 Vitamin D deficiency, unspecified: Secondary | ICD-10-CM | POA: Diagnosis not present

## 2017-04-20 DIAGNOSIS — K922 Gastrointestinal hemorrhage, unspecified: Secondary | ICD-10-CM | POA: Diagnosis not present

## 2017-04-20 DIAGNOSIS — M5010 Cervical disc disorder with radiculopathy, unspecified cervical region: Secondary | ICD-10-CM | POA: Diagnosis not present

## 2017-05-17 DIAGNOSIS — M545 Low back pain: Secondary | ICD-10-CM | POA: Diagnosis not present

## 2017-05-17 DIAGNOSIS — M546 Pain in thoracic spine: Secondary | ICD-10-CM | POA: Diagnosis not present

## 2017-05-17 DIAGNOSIS — M4304 Spondylolysis, thoracic region: Secondary | ICD-10-CM | POA: Diagnosis not present

## 2017-05-18 DIAGNOSIS — M546 Pain in thoracic spine: Secondary | ICD-10-CM | POA: Diagnosis not present

## 2017-05-18 DIAGNOSIS — M545 Low back pain: Secondary | ICD-10-CM | POA: Diagnosis not present

## 2017-06-07 DIAGNOSIS — M546 Pain in thoracic spine: Secondary | ICD-10-CM | POA: Diagnosis not present

## 2017-06-07 DIAGNOSIS — M5010 Cervical disc disorder with radiculopathy, unspecified cervical region: Secondary | ICD-10-CM | POA: Diagnosis not present

## 2017-06-07 DIAGNOSIS — M545 Low back pain: Secondary | ICD-10-CM | POA: Diagnosis not present

## 2017-06-07 DIAGNOSIS — M4304 Spondylolysis, thoracic region: Secondary | ICD-10-CM | POA: Diagnosis not present

## 2017-07-12 DIAGNOSIS — M5010 Cervical disc disorder with radiculopathy, unspecified cervical region: Secondary | ICD-10-CM | POA: Diagnosis not present

## 2017-07-12 DIAGNOSIS — M545 Low back pain: Secondary | ICD-10-CM | POA: Diagnosis not present

## 2017-07-12 DIAGNOSIS — M4304 Spondylolysis, thoracic region: Secondary | ICD-10-CM | POA: Diagnosis not present

## 2017-07-12 DIAGNOSIS — M546 Pain in thoracic spine: Secondary | ICD-10-CM | POA: Diagnosis not present

## 2017-07-19 DIAGNOSIS — M5010 Cervical disc disorder with radiculopathy, unspecified cervical region: Secondary | ICD-10-CM | POA: Diagnosis not present

## 2017-07-19 DIAGNOSIS — M545 Low back pain: Secondary | ICD-10-CM | POA: Diagnosis not present

## 2017-07-19 DIAGNOSIS — M546 Pain in thoracic spine: Secondary | ICD-10-CM | POA: Diagnosis not present

## 2017-07-19 DIAGNOSIS — M4304 Spondylolysis, thoracic region: Secondary | ICD-10-CM | POA: Diagnosis not present

## 2017-07-26 DIAGNOSIS — M4304 Spondylolysis, thoracic region: Secondary | ICD-10-CM | POA: Diagnosis not present

## 2017-07-26 DIAGNOSIS — M546 Pain in thoracic spine: Secondary | ICD-10-CM | POA: Diagnosis not present

## 2017-07-26 DIAGNOSIS — M545 Low back pain: Secondary | ICD-10-CM | POA: Diagnosis not present

## 2017-07-26 DIAGNOSIS — M5010 Cervical disc disorder with radiculopathy, unspecified cervical region: Secondary | ICD-10-CM | POA: Diagnosis not present

## 2017-08-09 DIAGNOSIS — M546 Pain in thoracic spine: Secondary | ICD-10-CM | POA: Diagnosis not present

## 2017-08-09 DIAGNOSIS — M545 Low back pain: Secondary | ICD-10-CM | POA: Diagnosis not present

## 2017-08-09 DIAGNOSIS — M5010 Cervical disc disorder with radiculopathy, unspecified cervical region: Secondary | ICD-10-CM | POA: Diagnosis not present

## 2017-08-09 DIAGNOSIS — M4304 Spondylolysis, thoracic region: Secondary | ICD-10-CM | POA: Diagnosis not present

## 2017-08-16 ENCOUNTER — Ambulatory Visit (INDEPENDENT_AMBULATORY_CARE_PROVIDER_SITE_OTHER): Payer: BLUE CROSS/BLUE SHIELD | Admitting: Obstetrics & Gynecology

## 2017-08-16 ENCOUNTER — Encounter: Payer: Self-pay | Admitting: Obstetrics & Gynecology

## 2017-08-16 VITALS — BP 130/84 | HR 68 | Resp 14 | Ht 62.5 in | Wt 252.0 lb

## 2017-08-16 DIAGNOSIS — M5010 Cervical disc disorder with radiculopathy, unspecified cervical region: Secondary | ICD-10-CM | POA: Diagnosis not present

## 2017-08-16 DIAGNOSIS — Z01419 Encounter for gynecological examination (general) (routine) without abnormal findings: Secondary | ICD-10-CM

## 2017-08-16 DIAGNOSIS — M546 Pain in thoracic spine: Secondary | ICD-10-CM | POA: Diagnosis not present

## 2017-08-16 DIAGNOSIS — M545 Low back pain: Secondary | ICD-10-CM | POA: Diagnosis not present

## 2017-08-16 DIAGNOSIS — M4304 Spondylolysis, thoracic region: Secondary | ICD-10-CM | POA: Diagnosis not present

## 2017-08-16 MED ORDER — ESTRADIOL 0.1 MG/GM VA CREA
TOPICAL_CREAM | VAGINAL | 3 refills | Status: DC
Start: 2017-08-16 — End: 2018-08-29

## 2017-08-16 MED ORDER — GABAPENTIN 100 MG PO CAPS: ORAL_CAPSULE | ORAL | 1 refills | Status: DC

## 2017-08-16 MED ORDER — VITAMIN D (ERGOCALCIFEROL) 1.25 MG (50000 UNIT) PO CAPS: 50000.0000 [IU] | ORAL_CAPSULE | ORAL | 4 refills | Status: DC

## 2017-08-16 NOTE — Progress Notes (Signed)
62 y.o. G1P1 MarriedCaucasianF here for annual exam.  Started having neck and occipital headaches.  Changed pillows and mattress.  Has congenital fusion of C6-C7.  Also has partial auto fusion of C3-C4.    She and her husband did a 2 week [place] cruise.  She and husband started with a trainer in January.  But started having mid back pain.  Did MRI of back.  Trainer has changed up work out.  Has been going to [employer].  Also working with trainer.  Using Skelaxin during the day and Tylenol.    She is now working just [employer], Wed and [employer].    Had gas leak at her home in March.  Had a lot of blood work done around that time.  I have this in my chart.    Denies vaginal bleeding.  Having some hot flashes.    Patient's last menstrual period was 02/03/2004.          Sexually active: Yes.    The current method of family planning is status post hysterectomy.    Exercising: Yes.    Gym/ health club routine includes [employer] . Smoker:  no  Health Maintenance: Pap:  06/12/16 neg. HR HPV:neg   06/28/13 Neg  History of abnormal Pap:  Yes, complex endometrial hyperplasia with focal atypia MMG:  09/02/16 BIRADS1:neg  Colonoscopy:  03/18/11 Normal. F/u 10 years BMD:   07/08/11  TDaP:  2010 Pneumonia vaccine(s):  no Shingrix:   no Hep C testing: 06/12/16 neg  [facility]   reports that she has never smoked. She has never used smokeless tobacco. She reports that she drinks alcohol. She reports that she does not use drugs.  Past Medical History:  Diagnosis Date  . Anemia   . GI bleed 01/21/2016  . History of Clostridium difficile 1995   hospitalized x 3 days  . Hx of migraines   . Madelung's deformity    right wrist  . Shingles 3/10, 11/16  . Vitamin D deficiency     Past Surgical History:  Procedure Laterality Date  . ABDOMINAL HYSTERECTOMY  2006   supra cervix  . CESAREAN SECTION    . DILATION AND CURETTAGE OF UTERUS    . TONSILLECTOMY    . WISDOM TOOTH  EXTRACTION      Current Outpatient Medications  Medication Sig Dispense Refill  . Calcium Carbonate-Vitamin D (CALTRATE 600+D PO) Take by mouth daily.    Marland Kitchen. Dexlansoprazole (DEXILANT) 30 MG capsule Take 30 mg by mouth daily.    Marland Kitchen. estradiol (ESTRACE VAGINAL) 0.1 MG/GM vaginal cream 1 gram vaginally two to three times weekly 42.5 g 3  . methocarbamol (ROBAXIN) 500 MG tablet Take 500 mg by mouth 4 (four) times daily.    . Multiple Vitamin (MULTIVITAMIN) tablet Take 1 tablet by mouth daily.    . vitamin B-12 (CYANOCOBALAMIN) 1000 MCG tablet Take 1,000 mcg by mouth daily.    . Vitamin D, Ergocalciferol, (DRISDOL) 50000 units CAPS capsule Take 1 capsule (50,000 Units total) by mouth every 7 (seven) days. 12 capsule 4  . Fe Cbn-Fe Gluc-FA-B12-C-DSS (FERRALET 90 PO) Take 1 tablet by mouth daily.     No current facility-administered medications for this visit.     Family History  Problem Relation Age of Onset  . Diabetes Maternal Uncle   . Lung cancer Maternal Uncle        mets-renal cell carcinoma  . Skin cancer Mother   . Thyroid  disease Mother        graves disease-now hypothyroid-treatment with radiation iodine  . Dementia Mother        mild  . Osteoporosis Mother   . Pancreatic cancer Maternal Grandmother   . Prostate cancer Paternal Grandfather   . Hypertension Father   . Heart attack Father        deceased age 65  . Congestive Heart Failure Maternal Grandfather   . COPD Maternal Grandfather   . Congestive Heart Failure Maternal Uncle        CABG    Review of Systems  Constitutional: Negative.   HENT: Negative.   Eyes: Negative.   Respiratory: Negative.   Cardiovascular: Negative.   Gastrointestinal: Negative.   Genitourinary: Negative.   Musculoskeletal: Positive for joint pain.  Skin: Negative.   Neurological: Negative.   Endo/Heme/Allergies: Negative.   Psychiatric/Behavioral: Negative.   All other systems reviewed and are negative.   Exam:   BP 130/84 (BP  Location: Right Arm)   Pulse 68   Resp 14   Ht 5' 2.5" (1.588 m)   Wt 252 lb (114.3 kg)   LMP 02/03/2004 Comment: supracx  BMI 45.36 kg/m    Height: 5' 2.5" (158.8 cm)  Ht Readings from Last 3 Encounters:  08/16/17 5' 2.5" (1.588 m)  06/12/16 5' 2.5" (1.588 m)  03/13/16 5\' 3"  (1.6 m)    General appearance: alert, cooperative and appears stated age Head: Normocephalic, without obvious abnormality, atraumatic Neck: no adenopathy, supple, symmetrical, trachea midline and thyroid normal to inspection and palpation Lungs: clear to auscultation bilaterally Breasts: normal appearance, no masses or tenderness Heart: regular rate and rhythm Abdomen: soft, non-tender; bowel sounds normal; no masses,  no organomegaly Extremities: extremities normal, atraumatic, no cyanosis or edema Skin: Skin color, texture, turgor normal. No rashes or lesions Lymph nodes: Cervical, supraclavicular, and axillary nodes normal. No abnormal inguinal nodes palpated Neurologic: Grossly normal   Pelvic: External genitalia:  no lesions              Urethra:  normal appearing urethra with no masses, tenderness or lesions              Bartholins and Skenes: normal                 Vagina: normal appearing vagina with normal color and discharge, no lesions              Cervix: absent              Pap taken: No. Bimanual Exam:  Uterus:  uterus absent              Adnexa: no mass, fullness, tenderness               Rectovaginal: Confirms               Anus:  normal sphincter tone, no lesions  Chaperone was present for exam.  A:  Well Woman with normal exam H/o supracervical TAH and h/o complex endometrial hyperplasia with focal atypia Vaginal atrophic changes treated with estrace cream Hot flashes H/O GI bleed with normalized hemoglobin and now off iron  P:   Mammogram guidelines reviewed.  This is UTD. pap smear  Vit 50K weekly.  #12/4RF Estrace vaginal cream 1gm pv twice weekly.  Rx to pharmacy. Order  for BMD given Trial of gabapentin 100mg  night and increase dosage to 300mg .  She will give me an update.  Shingrix order given today Return  annually or prn

## 2017-08-30 DIAGNOSIS — M545 Low back pain: Secondary | ICD-10-CM | POA: Diagnosis not present

## 2017-08-30 DIAGNOSIS — M546 Pain in thoracic spine: Secondary | ICD-10-CM | POA: Diagnosis not present

## 2017-08-30 DIAGNOSIS — M5010 Cervical disc disorder with radiculopathy, unspecified cervical region: Secondary | ICD-10-CM | POA: Diagnosis not present

## 2017-08-30 DIAGNOSIS — M4304 Spondylolysis, thoracic region: Secondary | ICD-10-CM | POA: Diagnosis not present

## 2017-09-13 DIAGNOSIS — M5010 Cervical disc disorder with radiculopathy, unspecified cervical region: Secondary | ICD-10-CM | POA: Diagnosis not present

## 2017-09-13 DIAGNOSIS — M4304 Spondylolysis, thoracic region: Secondary | ICD-10-CM | POA: Diagnosis not present

## 2017-09-13 DIAGNOSIS — M545 Low back pain: Secondary | ICD-10-CM | POA: Diagnosis not present

## 2017-09-13 DIAGNOSIS — M546 Pain in thoracic spine: Secondary | ICD-10-CM | POA: Diagnosis not present

## 2017-09-17 DIAGNOSIS — H5213 Myopia, bilateral: Secondary | ICD-10-CM | POA: Diagnosis not present

## 2017-09-20 DIAGNOSIS — M859 Disorder of bone density and structure, unspecified: Secondary | ICD-10-CM | POA: Diagnosis not present

## 2017-09-27 DIAGNOSIS — M4304 Spondylolysis, thoracic region: Secondary | ICD-10-CM | POA: Diagnosis not present

## 2017-09-27 DIAGNOSIS — M5010 Cervical disc disorder with radiculopathy, unspecified cervical region: Secondary | ICD-10-CM | POA: Diagnosis not present

## 2017-09-27 DIAGNOSIS — M546 Pain in thoracic spine: Secondary | ICD-10-CM | POA: Diagnosis not present

## 2017-09-27 DIAGNOSIS — M545 Low back pain: Secondary | ICD-10-CM | POA: Diagnosis not present

## 2017-10-07 DIAGNOSIS — Z23 Encounter for immunization: Secondary | ICD-10-CM | POA: Diagnosis not present

## 2017-10-25 DIAGNOSIS — R21 Rash and other nonspecific skin eruption: Secondary | ICD-10-CM | POA: Diagnosis not present

## 2017-10-25 DIAGNOSIS — L568 Other specified acute skin changes due to ultraviolet radiation: Secondary | ICD-10-CM | POA: Diagnosis not present

## 2017-10-25 DIAGNOSIS — D2262 Melanocytic nevi of left upper limb, including shoulder: Secondary | ICD-10-CM | POA: Diagnosis not present

## 2017-10-25 DIAGNOSIS — L821 Other seborrheic keratosis: Secondary | ICD-10-CM | POA: Diagnosis not present

## 2017-10-25 DIAGNOSIS — M67949 Unspecified disorder of synovium and tendon, unspecified hand: Secondary | ICD-10-CM | POA: Diagnosis not present

## 2017-10-25 DIAGNOSIS — L738 Other specified follicular disorders: Secondary | ICD-10-CM | POA: Diagnosis not present

## 2017-10-26 DIAGNOSIS — M67949 Unspecified disorder of synovium and tendon, unspecified hand: Secondary | ICD-10-CM | POA: Diagnosis not present

## 2017-12-03 DIAGNOSIS — Z1231 Encounter for screening mammogram for malignant neoplasm of breast: Secondary | ICD-10-CM | POA: Diagnosis not present

## 2017-12-21 ENCOUNTER — Encounter: Payer: Self-pay | Admitting: Obstetrics & Gynecology

## 2018-03-21 ENCOUNTER — Ambulatory Visit (INDEPENDENT_AMBULATORY_CARE_PROVIDER_SITE_OTHER): Payer: BLUE CROSS/BLUE SHIELD | Admitting: Psychology

## 2018-03-21 DIAGNOSIS — F4323 Adjustment disorder with mixed anxiety and depressed mood: Secondary | ICD-10-CM

## 2018-04-11 ENCOUNTER — Ambulatory Visit (INDEPENDENT_AMBULATORY_CARE_PROVIDER_SITE_OTHER): Payer: BLUE CROSS/BLUE SHIELD | Admitting: Psychology

## 2018-04-11 DIAGNOSIS — F4323 Adjustment disorder with mixed anxiety and depressed mood: Secondary | ICD-10-CM

## 2018-04-25 ENCOUNTER — Ambulatory Visit (INDEPENDENT_AMBULATORY_CARE_PROVIDER_SITE_OTHER): Payer: BLUE CROSS/BLUE SHIELD | Admitting: Psychology

## 2018-04-25 DIAGNOSIS — F4323 Adjustment disorder with mixed anxiety and depressed mood: Secondary | ICD-10-CM | POA: Diagnosis not present

## 2018-06-03 ENCOUNTER — Ambulatory Visit (INDEPENDENT_AMBULATORY_CARE_PROVIDER_SITE_OTHER): Payer: BLUE CROSS/BLUE SHIELD | Admitting: Psychology

## 2018-06-03 DIAGNOSIS — F4323 Adjustment disorder with mixed anxiety and depressed mood: Secondary | ICD-10-CM | POA: Diagnosis not present

## 2018-06-14 DIAGNOSIS — R1013 Epigastric pain: Secondary | ICD-10-CM | POA: Diagnosis not present

## 2018-06-15 DIAGNOSIS — K922 Gastrointestinal hemorrhage, unspecified: Secondary | ICD-10-CM | POA: Diagnosis not present

## 2018-06-17 ENCOUNTER — Ambulatory Visit (INDEPENDENT_AMBULATORY_CARE_PROVIDER_SITE_OTHER): Payer: BLUE CROSS/BLUE SHIELD | Admitting: Psychology

## 2018-06-17 DIAGNOSIS — F4323 Adjustment disorder with mixed anxiety and depressed mood: Secondary | ICD-10-CM | POA: Diagnosis not present

## 2018-06-20 ENCOUNTER — Encounter: Payer: Self-pay | Admitting: Internal Medicine

## 2018-06-20 ENCOUNTER — Telehealth: Payer: Self-pay

## 2018-06-20 ENCOUNTER — Ambulatory Visit (INDEPENDENT_AMBULATORY_CARE_PROVIDER_SITE_OTHER): Payer: BLUE CROSS/BLUE SHIELD | Admitting: Internal Medicine

## 2018-06-20 ENCOUNTER — Other Ambulatory Visit: Payer: Self-pay

## 2018-06-20 VITALS — Ht 63.5 in | Wt 246.0 lb

## 2018-06-20 DIAGNOSIS — R1013 Epigastric pain: Secondary | ICD-10-CM

## 2018-06-20 NOTE — Telephone Encounter (Signed)
Per Dr. Marina Goodell, spoke with patient and moved her Webex appointment today from 4:00pm to 11:00am

## 2018-06-20 NOTE — Patient Instructions (Addendum)
1.  Avoid NSAIDs if possible 2.  If NSAIDs are necessary then could administer PPI throughout 3.  Her current symptoms are related to NSAID related ulcer, continue PPI for an additional 4 weeks then stop as long as not taking NSAID 4.  For recurrent symptoms I would recommend upper endoscopy and abdominal ultrasound.  She understands and agrees to contact the office if she has any issues 5.  Resume general medical care with Dr. Evlyn Kanner

## 2018-06-20 NOTE — Progress Notes (Signed)
HISTORY OF PRESENT ILLNESS:  Judith LarsenSue E Salazar is a 63 y.o. 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  I saw the patient on one occasion February 11, 2016 after she had an upper GI bleed elsewhere while traveling.  She was found to have a normocytic anemia with definite iron deficiency and borderline B12 deficiency.  At that time both colonoscopy and upper endoscopy were recommended.  Patient to schedule those examinations but subsequently canceled without rescheduling.  She has not been seen since.  Last year she had significant problems with her cervical spine for which she underwent intensive physical therapy.  In March 2020 with increased computer work she developed significant neck strain for which she was taking Motrin 600 mg daily with food for about 1 month.  About 2 weeks ago she developed epigastric pain minimal nausea, bloating, and early satiety.  She took a laxative which did not help.  She stopped Motrin and initiated Carafate as well as PPI therapy in the form of Dexilant.  After 8 to 10 days she reports resolution of her symptoms.  Outside blood work from [facility] shows a hemoglobin of 15.8 and negative helical factor pylori serologies.  Her GI review of systems is otherwise negative.  Her weight is stable.  REVIEW OF SYSTEMS:  All non-GI ROS unless otherwise stated in the HPI negative except for arthritis  Past Medical History:  Diagnosis Date  . Anemia   . DDD (degenerative disc disease), cervical   . Endometrial hyperplasia with atypia   . GI bleed 01/21/2016  . History of Clostridium difficile 1995   hospitalized x 3 days  . Hx of migraines   . Madelung's deformity    right wrist  . Shingles 3/10, 11/16  . Vitamin D deficiency     Past Surgical History:  Procedure Laterality Date  . ABDOMINAL HYSTERECTOMY  2006   supra cervix  . CESAREAN SECTION     . DILATION AND CURETTAGE OF UTERUS    . TONSILLECTOMY    . WISDOM TOOTH EXTRACTION      Social History Judith LarsenSue E Salazar  reports that she has never smoked. She has never used smokeless tobacco. She reports current alcohol use. She reports that she does not use drugs.  family history includes COPD in her maternal grandfather; Congestive Heart Failure in her maternal grandfather and maternal uncle; Dementia in her mother; Diabetes in her maternal uncle; Heart attack in her father; Hypertension in her father; Lung cancer in her maternal uncle; Osteoporosis in her mother; Pancreatic cancer in her maternal grandmother; Prostate cancer in her paternal grandfather; Skin cancer in her mother; Thyroid disease in her mother.  No Known Allergies     PHYSICAL EXAMINATION: No physical examination with telehealth visit   ASSESSMENT:  1.  Epigastric pain as described.  Suspect NSAID related gastropathy and/or ulcer.  Resolved off NSAID on PPI and Carafate. 2.  Previous history of iron deficiency anemia.  Work-up recommended.  Patient decided to forego work-up as her anemia resolved 3.  Normal colonoscopy with Dr. Juanda ChanceBrodie February 2013   PLAN:  1.  Avoid NSAIDs if possible 2.  If NSAIDs are necessary then could administer PPI throughout 3.  His current symptoms are related to NSAID related ulcer, continue PPI for an additional 4 weeks then stop as long as not taking NSAID 4.  For recurrent symptoms I would recommend upper endoscopy and abdominal  ultrasound.  She understands and agrees to contact the office if she has any issues 5.  Resume general medical care with Dr. Evlyn Kanner The scheduled WebEx telehealth visit was initiated by the patient who provided consent.  She was in her workplace and I was in my office during the encounter.  She understands her may be an associated professional charge for the service.

## 2018-07-08 ENCOUNTER — Ambulatory Visit: Payer: BLUE CROSS/BLUE SHIELD | Admitting: Psychology

## 2018-07-22 ENCOUNTER — Ambulatory Visit (INDEPENDENT_AMBULATORY_CARE_PROVIDER_SITE_OTHER): Payer: BC Managed Care – PPO | Admitting: Psychology

## 2018-07-22 DIAGNOSIS — F4323 Adjustment disorder with mixed anxiety and depressed mood: Secondary | ICD-10-CM

## 2018-08-29 ENCOUNTER — Other Ambulatory Visit: Payer: Self-pay | Admitting: Obstetrics & Gynecology

## 2018-08-29 NOTE — Telephone Encounter (Signed)
Medication refill request: Vitamin D, estradiol vag cream  Last AEX:  08/16/17 SM Next AEX: 12/02/18 SM Last MMG (if hormonal medication request): 12/03/17 BIRADS1:neg  Refill authorized: Both sent 08/16/17. Vit D #12caps/4R, estrace vag cream #42.5g/3R. Today please advise.

## 2018-09-02 ENCOUNTER — Ambulatory Visit (INDEPENDENT_AMBULATORY_CARE_PROVIDER_SITE_OTHER): Payer: BC Managed Care – PPO | Admitting: Psychology

## 2018-09-02 DIAGNOSIS — F4323 Adjustment disorder with mixed anxiety and depressed mood: Secondary | ICD-10-CM | POA: Diagnosis not present

## 2018-10-07 ENCOUNTER — Ambulatory Visit (INDEPENDENT_AMBULATORY_CARE_PROVIDER_SITE_OTHER): Payer: BC Managed Care – PPO | Admitting: Psychology

## 2018-10-07 DIAGNOSIS — F4323 Adjustment disorder with mixed anxiety and depressed mood: Secondary | ICD-10-CM | POA: Diagnosis not present

## 2018-10-28 ENCOUNTER — Ambulatory Visit: Payer: BC Managed Care – PPO | Admitting: Psychology

## 2018-11-16 ENCOUNTER — Telehealth (HOSPITAL_COMMUNITY): Payer: Self-pay | Admitting: *Deleted

## 2018-11-16 NOTE — Telephone Encounter (Signed)
11/16/18 10:00 am left VM asking for return call.

## 2018-11-16 NOTE — Telephone Encounter (Signed)
11/16/2018 no to all COVID questions per Anderson Malta at [facility].

## 2018-11-17 ENCOUNTER — Other Ambulatory Visit: Payer: Self-pay

## 2018-11-17 ENCOUNTER — Other Ambulatory Visit (HOSPITAL_COMMUNITY): Payer: Self-pay | Admitting: Endocrinology

## 2018-11-17 ENCOUNTER — Ambulatory Visit (HOSPITAL_COMMUNITY)
Admission: RE | Admit: 2018-11-17 | Discharge: 2018-11-17 | Disposition: A | Discharge status: Home / Self Care | Payer: BC Managed Care – PPO | Source: Ambulatory Visit | Attending: Family | Admitting: Family

## 2018-11-17 DIAGNOSIS — M79651 Pain in right thigh: Secondary | ICD-10-CM

## 2018-11-18 ENCOUNTER — Ambulatory Visit (INDEPENDENT_AMBULATORY_CARE_PROVIDER_SITE_OTHER): Payer: BC Managed Care – PPO | Admitting: Psychology

## 2018-11-18 ENCOUNTER — Ambulatory Visit
Admission: RE | Admit: 2018-11-18 | Discharge: 2018-11-18 | Disposition: A | Discharge status: Home / Self Care | Payer: BC Managed Care – PPO | Source: Ambulatory Visit | Attending: Internal Medicine | Admitting: Internal Medicine

## 2018-11-18 ENCOUNTER — Other Ambulatory Visit: Payer: Self-pay | Admitting: Internal Medicine

## 2018-11-18 ENCOUNTER — Encounter (HOSPITAL_COMMUNITY): Payer: BC Managed Care – PPO

## 2018-11-18 DIAGNOSIS — M1711 Unilateral primary osteoarthritis, right knee: Secondary | ICD-10-CM | POA: Diagnosis not present

## 2018-11-18 DIAGNOSIS — M79651 Pain in right thigh: Secondary | ICD-10-CM

## 2018-11-18 DIAGNOSIS — M25551 Pain in right hip: Secondary | ICD-10-CM | POA: Diagnosis not present

## 2018-11-18 DIAGNOSIS — F4323 Adjustment disorder with mixed anxiety and depressed mood: Secondary | ICD-10-CM

## 2018-11-30 ENCOUNTER — Other Ambulatory Visit: Payer: Self-pay

## 2018-12-02 ENCOUNTER — Encounter

## 2018-12-02 ENCOUNTER — Ambulatory Visit: Payer: BC Managed Care – PPO | Admitting: Obstetrics & Gynecology

## 2018-12-02 ENCOUNTER — Encounter: Payer: Self-pay | Admitting: Obstetrics & Gynecology

## 2018-12-02 ENCOUNTER — Other Ambulatory Visit: Payer: Self-pay

## 2018-12-02 VITALS — BP 124/80 | HR 70 | Temp 97.2°F | Resp 14 | Ht 62.5 in | Wt 257.0 lb

## 2018-12-02 DIAGNOSIS — Z23 Encounter for immunization: Secondary | ICD-10-CM | POA: Diagnosis not present

## 2018-12-02 DIAGNOSIS — Z01419 Encounter for gynecological examination (general) (routine) without abnormal findings: Secondary | ICD-10-CM

## 2018-12-02 MED ORDER — VITAMIN D (ERGOCALCIFEROL) 1.25 MG (50000 UNIT) PO CAPS: ORAL_CAPSULE | ORAL | 4 refills | Status: DC

## 2018-12-02 MED ORDER — ESTRADIOL 0.1 MG/GM VA CREA: TOPICAL_CREAM | VAGINAL | 3 refills | Status: DC

## 2018-12-02 NOTE — Progress Notes (Signed)
63 y.o. G1P1 Married Caucasian female here for annual exam. She states she had a bone density scan in 2016, she will fax the report. Patient's 78 year old daughter has recently been diagnosed with kidney cancer.  She had DVTs and this started the investigation.  It is 1cm so surgeon thinks this will be a cure.  Coagulopathy testing and genetic testing is negative.  November 11th will be 90 days from when she started eliquis.  Surgery scheduled for November 18th is scheduled.  This is of course very stressful for patient.     She is going to keep her daughter post op.    Work is stressful  Patient's last menstrual period was 02/03/2004.          Sexually active: Yes.    The current method of family planning is status post hysterectomy.    Exercising: No. Smoker:  no  Health Maintenance: Pap:  06/12/16 neg. HR HPV:neg              06/28/13 Neg  History of abnormal Pap:  Yes, complex endometrial hyperplasia with focal atypia MMG:  12/03/17 BIRADS 1 negative/density a Colonoscopy:  03/18/11 Normal. F/u 10 years BMD:   07/08/11 TDaP:  2010 Pneumonia vaccine(s):  No Shingrix:   No Hep C testing: 06/12/16 Neg Screening Labs: Done at [facility]   reports that she has never smoked. She has never used smokeless tobacco. She reports current alcohol use. She reports that she does not use drugs.  Past Medical History:  Diagnosis Date  . Anemia   . DDD (degenerative disc disease), cervical   . Endometrial hyperplasia with atypia   . GI bleed 01/21/2016  . History of Clostridium difficile 1995   hospitalized x 3 days  . Hx of migraines   . Madelung's deformity    right wrist  . Shingles 3/10, 11/16  . Vitamin D deficiency     Past Surgical History:  Procedure Laterality Date  . ABDOMINAL HYSTERECTOMY  2006   supra cervix  . CESAREAN SECTION    . DILATION AND CURETTAGE OF UTERUS    . TONSILLECTOMY    . WISDOM TOOTH EXTRACTION      Current Outpatient Medications  Medication Sig  Dispense Refill  . Calcium Carbonate-Vitamin D (CALTRATE 600+D PO) Take by mouth daily.    Marland Kitchen Dexlansoprazole (DEXILANT) 30 MG capsule Take 30 mg by mouth daily.    Marland Kitchen estradiol (ESTRACE) 0.1 MG/GM vaginal cream INSERT 1 GRAM VAGINALLY AT BEDTIME 2 TO 3 TIMES A WEEK. 42.5 g 0  . methocarbamol (ROBAXIN) 500 MG tablet Take 500 mg by mouth 4 (four) times daily.    . Multiple Vitamin (MULTIVITAMIN) tablet Take 1 tablet by mouth daily.    . vitamin B-12 (CYANOCOBALAMIN) 1000 MCG tablet Take 1,000 mcg by mouth daily.    . Vitamin D, Ergocalciferol, (DRISDOL) 1.25 MG (50000 UT) CAPS capsule TAKE 1 CAPSULE ONCE A WEEK. 12 capsule 0   No current facility-administered medications for this visit.     Family History  Problem Relation Age of Onset  . Diabetes Maternal Uncle   . Lung cancer Maternal Uncle        mets-renal cell carcinoma  . Skin cancer Mother   . Thyroid disease Mother        graves disease-now hypothyroid-treatment with radiation iodine  . Dementia Mother        mild  . Osteoporosis Mother   . Pancreatic cancer Maternal Grandmother   .  Prostate cancer Paternal Grandfather   . Hypertension Father   . Heart attack Father        deceased age 75  . Congestive Heart Failure Maternal Grandfather   . COPD Maternal Grandfather   . Congestive Heart Failure Maternal Uncle        CABG    Review of Systems  Constitutional: Negative.   HENT: Negative.   Eyes: Negative.   Respiratory: Negative.   Cardiovascular: Negative.   Gastrointestinal: Negative.   Endocrine: Negative.   Genitourinary: Negative.   Musculoskeletal: Negative.   Skin: Negative.   Allergic/Immunologic: Negative.   Neurological: Negative.   Hematological: Negative.   Psychiatric/Behavioral: Negative.     Exam:   Vitals:   12/02/18 1422  BP: 124/80  Pulse: 70  Resp: 14  Temp: (!) 97.2 F (36.2 C)    General appearance: alert, cooperative and appears stated age Head: Normocephalic, without obvious  abnormality, atraumatic Neck: no adenopathy, supple, symmetrical, trachea midline and thyroid normal to inspection and palpation Lungs: clear to auscultation bilaterally Breasts: normal appearance, no masses or tenderness Heart: regular rate and rhythm Abdomen: soft, non-tender; bowel sounds normal; no masses,  no organomegaly Extremities: extremities normal, atraumatic, no cyanosis or edema Skin: Skin color, texture, turgor normal. No rashes or lesions Lymph nodes: Cervical, supraclavicular, and axillary nodes normal. No abnormal inguinal nodes palpated Neurologic: Grossly normal  Pelvic: External genitalia:  no lesions              Urethra:  normal appearing urethra with no masses, tenderness or lesions              Bartholins and Skenes: normal                 Vagina: normal appearing vagina with normal color and discharge, no lesions              Cervix: absent              Pap taken: No. Bimanual Exam:  Uterus:  uterus absent              Adnexa: normal adnexa and no mass, fullness, tenderness               Rectovaginal: Confirms               Anus:  normal sphincter tone, no lesions  Chaperone was present for exam.  A:  Well Woman with normal exam H/o supracervical TAH with h/o complex endometrial hyperplasia with focal atypia H/o vaginal atrophy that improved with estrace cream H/o GI bleed with anemia that resolved  P:   Mammogram guidelines reviewed. pap smear with HR HPV obtained 5/18.  Not indicated today. Vit D weekly.  #12/4RF RF for estrace vaginal cream 1gm pv twice weekly.  Rx to pharmacy. Colonoscopy is UTD Tdap is updated today Return annually or prn

## 2018-12-05 ENCOUNTER — Encounter: Payer: Self-pay | Admitting: Obstetrics & Gynecology

## 2018-12-05 DIAGNOSIS — Z1231 Encounter for screening mammogram for malignant neoplasm of breast: Secondary | ICD-10-CM | POA: Diagnosis not present

## 2019-01-06 ENCOUNTER — Ambulatory Visit (INDEPENDENT_AMBULATORY_CARE_PROVIDER_SITE_OTHER): Payer: BC Managed Care – PPO | Admitting: Psychology

## 2019-01-06 DIAGNOSIS — F4323 Adjustment disorder with mixed anxiety and depressed mood: Secondary | ICD-10-CM | POA: Diagnosis not present

## 2019-01-31 DIAGNOSIS — M25561 Pain in right knee: Secondary | ICD-10-CM | POA: Diagnosis not present

## 2019-02-06 DIAGNOSIS — M25561 Pain in right knee: Secondary | ICD-10-CM | POA: Diagnosis not present

## 2019-02-10 ENCOUNTER — Ambulatory Visit: Payer: BC Managed Care – PPO | Admitting: Psychology

## 2019-02-16 DIAGNOSIS — M25561 Pain in right knee: Secondary | ICD-10-CM | POA: Diagnosis not present

## 2019-03-27 DIAGNOSIS — M25561 Pain in right knee: Secondary | ICD-10-CM | POA: Diagnosis not present

## 2019-04-07 DIAGNOSIS — M1711 Unilateral primary osteoarthritis, right knee: Secondary | ICD-10-CM | POA: Diagnosis not present

## 2019-04-07 DIAGNOSIS — M25561 Pain in right knee: Secondary | ICD-10-CM | POA: Diagnosis not present

## 2019-04-07 DIAGNOSIS — S83241A Other tear of medial meniscus, current injury, right knee, initial encounter: Secondary | ICD-10-CM | POA: Diagnosis not present

## 2019-09-12 DIAGNOSIS — Z20822 Contact with and (suspected) exposure to covid-19: Secondary | ICD-10-CM | POA: Diagnosis not present

## 2019-09-13 DIAGNOSIS — Z20822 Contact with and (suspected) exposure to covid-19: Secondary | ICD-10-CM | POA: Diagnosis not present

## 2019-12-11 DIAGNOSIS — Z1231 Encounter for screening mammogram for malignant neoplasm of breast: Secondary | ICD-10-CM | POA: Diagnosis not present

## 2019-12-13 DIAGNOSIS — K529 Noninfective gastroenteritis and colitis, unspecified: Secondary | ICD-10-CM | POA: Diagnosis not present

## 2019-12-13 DIAGNOSIS — Z23 Encounter for immunization: Secondary | ICD-10-CM | POA: Diagnosis not present

## 2019-12-18 ENCOUNTER — Other Ambulatory Visit: Payer: Self-pay | Admitting: Endocrinology

## 2019-12-18 ENCOUNTER — Other Ambulatory Visit: Payer: Self-pay

## 2019-12-18 ENCOUNTER — Telehealth: Payer: Self-pay | Admitting: Internal Medicine

## 2019-12-18 ENCOUNTER — Ambulatory Visit
Admission: RE | Admit: 2019-12-18 | Discharge: 2019-12-18 | Disposition: A | Discharge status: Home / Self Care | Payer: BC Managed Care – PPO | Source: Ambulatory Visit | Attending: Endocrinology | Admitting: Endocrinology

## 2019-12-18 DIAGNOSIS — K5730 Diverticulosis of large intestine without perforation or abscess without bleeding: Secondary | ICD-10-CM | POA: Diagnosis not present

## 2019-12-18 DIAGNOSIS — R109 Unspecified abdominal pain: Secondary | ICD-10-CM

## 2019-12-18 DIAGNOSIS — K625 Hemorrhage of anus and rectum: Secondary | ICD-10-CM

## 2019-12-18 DIAGNOSIS — K529 Noninfective gastroenteritis and colitis, unspecified: Secondary | ICD-10-CM | POA: Diagnosis not present

## 2019-12-18 MED ORDER — IOPAMIDOL (ISOVUE-300) INJECTION 61%
100.0000 mL | Freq: Once | INTRAVENOUS | Status: AC | PRN
  Administered 2019-12-18: 100 mL via INTRAVENOUS

## 2019-12-18 NOTE — Progress Notes (Signed)
Pt came back to [facility] imaging after she had left from her CT scan with complaints of her Left lower arm "swollen and tender". Pt brought back to nurses station. Pts left arm was visibly swollen. Pt denied tingling in left lower extremity. Pt could feel sensation equally on both hands. Pt could move fingers on left arm. Pt reported "it feels tight". Dr. Alfredo Batty notified of pt, assessed pts arm and spoke to patient. Dr. Alfredo Batty instructed pt to elevate the arm as much as possible today and to ice the affected extremity on and off today. He also advised the patient if the arm became more swollen or any other symptoms present, pt should go to the emergency room to be evaluated. Pt verbalized understanding.

## 2019-12-18 NOTE — Telephone Encounter (Signed)
Victorino Dike from [facility] called asking to work this patient in for anyone APP or Dr. Marina Goodell for the patient is experiencing abdominal pain, nausea and rectal bleeding the provider has her doing a CT abd\pelvic scan right now as a walk in. Victorino Dike asking to please call her at [phone]

## 2019-12-18 NOTE — Telephone Encounter (Signed)
Pt scheduled to see Hyacinth Meeker PA 12/20/19@3pm , Victorino Dike to notify pt of appt date and time.

## 2019-12-20 ENCOUNTER — Encounter: Payer: Self-pay | Admitting: Physician Assistant

## 2019-12-20 ENCOUNTER — Ambulatory Visit (INDEPENDENT_AMBULATORY_CARE_PROVIDER_SITE_OTHER): Payer: BC Managed Care – PPO | Admitting: Physician Assistant

## 2019-12-20 VITALS — BP 138/74 | HR 72 | Ht 63.0 in | Wt 254.0 lb

## 2019-12-20 DIAGNOSIS — K559 Vascular disorder of intestine, unspecified: Secondary | ICD-10-CM

## 2019-12-20 NOTE — Progress Notes (Signed)
Noted.  I appreciate you seeing her in follow-up

## 2019-12-20 NOTE — Progress Notes (Signed)
Chief Complaint: Abdominal pain, diarrhea  HPI:    Judith Salazar is a 64 year old female, known to Dr. Marina Goodell, who was referred to me by Adrian Prince, MD for a complaint of abdominal pain and diarrhea.    03/18/2011 colonoscopy normal, repeat recommended in 10 years.    12/18/2019 CT of the abdomen pelvis with contrast with evidence of acute colitis involving the descending colon which is likely infectious or inflammatory in nature, minimal colonic diverticulosis, aortic atherosclerosis.  Dr.  Reviewed and described the clinical picture and CT findings are most consistent with acute ischemic colitis.  She had a normal white count.  It was recommended that she have rest, hydration and a modified diet.    Today, the patient resents to clinic and tells me on Saturday around 5 PM she just started not feeling good and then started with chills and then started with stomach tightness and about an hour later she had severe cramping which lasted until about midnight when she started having dry heaves and vomiting and then started with explosive diarrhea.  About 24 hours after her initial onset of symptoms she saw a lot of bright red blood, she describes this as almost mucousy with her bowel movements.  Also describes continued bloating and nausea but no further diarrhea since Sunday morning.  Tells me she has had very minimal to eat and drink.  Continues now with cramps when she is walking around that almost feel like "my intestines will fall out".  Tells me that her left side is the best side to lay on his it does not feel like anything is pulling.  Today, she has not had any nausea and no chills or fever since yesterday morning.  Describes that now she is only seeing some bright red blood when she wipes and is having soft bowel movements about once a day but "I am not eating anything".  Overall beginning to feel slightly better.  Past Medical History:  Diagnosis Date  . Anemia   . DDD (degenerative disc  disease), cervical   . Endometrial hyperplasia with atypia   . GI bleed 01/21/2016  . History of Clostridium difficile 1995   hospitalized x 3 days  . Hx of migraines   . Madelung's deformity    right wrist  . Shingles 3/10, 11/16  . Vitamin D deficiency     Past Surgical History:  Procedure Laterality Date  . ABDOMINAL HYSTERECTOMY  2006   supra cervix  . CESAREAN SECTION    . DILATION AND CURETTAGE OF UTERUS    . TONSILLECTOMY    . WISDOM TOOTH EXTRACTION      Current Outpatient Medications  Medication Sig Dispense Refill  . Calcium Carbonate-Vitamin D (CALTRATE 600+D PO) Take by mouth daily.    Marland Kitchen Dexlansoprazole (DEXILANT) 30 MG capsule Take 30 mg by mouth daily.    Marland Kitchen estradiol (ESTRACE) 0.1 MG/GM vaginal cream INSERT 1 GRAM VAGINALLY AT BEDTIME 2 TO 3 TIMES A WEEK. 42.5 g 3  . methocarbamol (ROBAXIN) 500 MG tablet Take 500 mg by mouth 4 (four) times daily.    . Multiple Vitamin (MULTIVITAMIN) tablet Take 1 tablet by mouth daily.    . sucralfate (CARAFATE) 1 g tablet     . vitamin B-12 (CYANOCOBALAMIN) 1000 MCG tablet Take 1,000 mcg by mouth daily.    . Vitamin D, Ergocalciferol, (DRISDOL) 1.25 MG (50000 UT) CAPS capsule TAKE 1 CAPSULE ONCE A WEEK. 12 capsule 4   No current facility-administered medications for  this visit.    Allergies as of 12/20/2019  . (No Known Allergies)    Family History  Problem Relation Age of Onset  . Diabetes Maternal Uncle   . Lung cancer Maternal Uncle        mets-renal cell carcinoma  . Skin cancer Mother   . Thyroid disease Mother        graves disease-now hypothyroid-treatment with radiation iodine  . Dementia Mother        mild  . Osteoporosis Mother   . Pancreatic cancer Maternal Grandmother   . Prostate cancer Paternal Grandfather   . Hypertension Father   . Heart attack Father        deceased age 4  . Congestive Heart Failure Maternal Grandfather   . COPD Maternal Grandfather   . Congestive Heart Failure Maternal Uncle         CABG  . Cancer Daughter     Social History   Socioeconomic History  . Marital status: Married    Spouse name: Not on file  . Number of children: 1  . Years of education: Not on file  . Highest education level: Not on file  Occupational History  . Occupation: NP-CS, Risk analyst  Tobacco Use  . Smoking status: Never Smoker  . Smokeless tobacco: Never Used  Vaping Use  . Vaping Use: Never used  Substance and Sexual Activity  . Alcohol use: Yes    Comment: one monthly  . Drug use: No  . Sexual activity: Yes    Partners: Male    Birth control/protection: Surgical    Comment: TAH  Other Topics Concern  . Not on file  Social History Narrative  . Not on file   Social Determinants of Health   Financial Resource Strain:   . Difficulty of Paying Living Expenses: Not on file  Food Insecurity:   . Worried About Programme researcher, broadcasting/film/video in the Last Year: Not on file  . Ran Out of Food in the Last Year: Not on file  Transportation Needs:   . Lack of Transportation (Medical): Not on file  . Lack of Transportation (Non-Medical): Not on file  Physical Activity:   . Days of Exercise per Week: Not on file  . Minutes of Exercise per Session: Not on file  Stress:   . Feeling of Stress : Not on file  Social Connections:   . Frequency of Communication with Friends and Family: Not on file  . Frequency of Social Gatherings with Friends and Family: Not on file  . Attends Religious Services: Not on file  . Active Member of Clubs or Organizations: Not on file  . Attends Banker Meetings: Not on file  . Marital Status: Not on file  Intimate Partner Violence:   . Fear of Current or Ex-Partner: Not on file  . Emotionally Abused: Not on file  . Physically Abused: Not on file  . Sexually Abused: Not on file    Review of Systems:    Constitutional: No weight loss Cardiovascular: No chest pain Respiratory: No SOB  Gastrointestinal: See HPI and otherwise negative    Physical Exam:  Vital signs: BP 138/74   Pulse 72   Ht 5\' 3"  (1.6 m)   Wt 254 lb (115.2 kg)   LMP 02/03/2004 Comment: supracx  BMI 44.99 kg/m   Constitutional:   Pleasant overweight Caucasian female appears to be in NAD, Well developed, Well nourished, alert and cooperative Respiratory: Respirations even and unlabored. Lungs clear  to auscultation bilaterally.   No wheezes, crackles, or rhonchi.  Cardiovascular: Normal S1, S2. No MRG. Regular rate and rhythm. No peripheral edema, cyanosis or pallor.  Gastrointestinal:  Soft, nondistended, marked TTP with some guarding on the left side of abdomen, normal bowel sounds. No appreciable masses or hepatomegaly. Psychiatric: Demonstrates good judgement and reason without abnormal affect or behaviors.  See HPI for recent imaging and labs.  Assessment: 1.  Ischemic colitis: Confirmed on CT via Dr. Marina Goodell, symptoms started Saturday, 12/16/2019 are starting to resolve today  Plan: 1.  Discussed ischemic colitis with the patient.  Recommendations to stay well-hydrated.  Would recommend a clear liquid diet for now and can increase to a low fiber diet as tolerated. 2.  Discussed with the patient that if the bleeding does not stop with her symptoms then recommend she call and let us know.  At that point may need to do further investigation, possibly move her colonoscopy up (not due until 03/17/2021 for screening). 3.  Patient will call if her symptoms worsen at any point, at that time would recommend repeat imaging.  Hyacinth Meeker, PA-C [facility] 12/20/2019, 2:57 PM  Cc: Adrian Prince, MD

## 2019-12-20 NOTE — Patient Instructions (Signed)
You have been given lower fiber, low residue diet.   Follow up as needed.

## 2020-02-09 ENCOUNTER — Other Ambulatory Visit: Payer: Self-pay | Admitting: Obstetrics & Gynecology

## 2020-02-09 NOTE — Telephone Encounter (Signed)
Medication refill request: Vitamin D and Estradiol Last AEX:  12/02/18 Dr. Hyacinth Meeker Next AEX: none Last MMG (if hormonal medication request): 12/05/18 BIRADS 1 negative/density a Refill authorized: today, please advise

## 2020-02-09 NOTE — Telephone Encounter (Signed)
She needs a vit d level prior to refill on the vit d. 1 tube of estrace cream sent. She needs to get her mammogram and schedule her annual exam prior to any further refills. Please inform.

## 2020-07-13 IMAGING — CR DG KNEE COMPLETE 4+V*R*
4 series · 4 of 4 positions shown · non-contrast
Comparison: None.

CLINICAL DATA: Right hip pain radiating medially to the right knee
for 2 weeks. No injury.

EXAM:
RIGHT KNEE - COMPLETE 4+ VIEW

[w knee ap right]
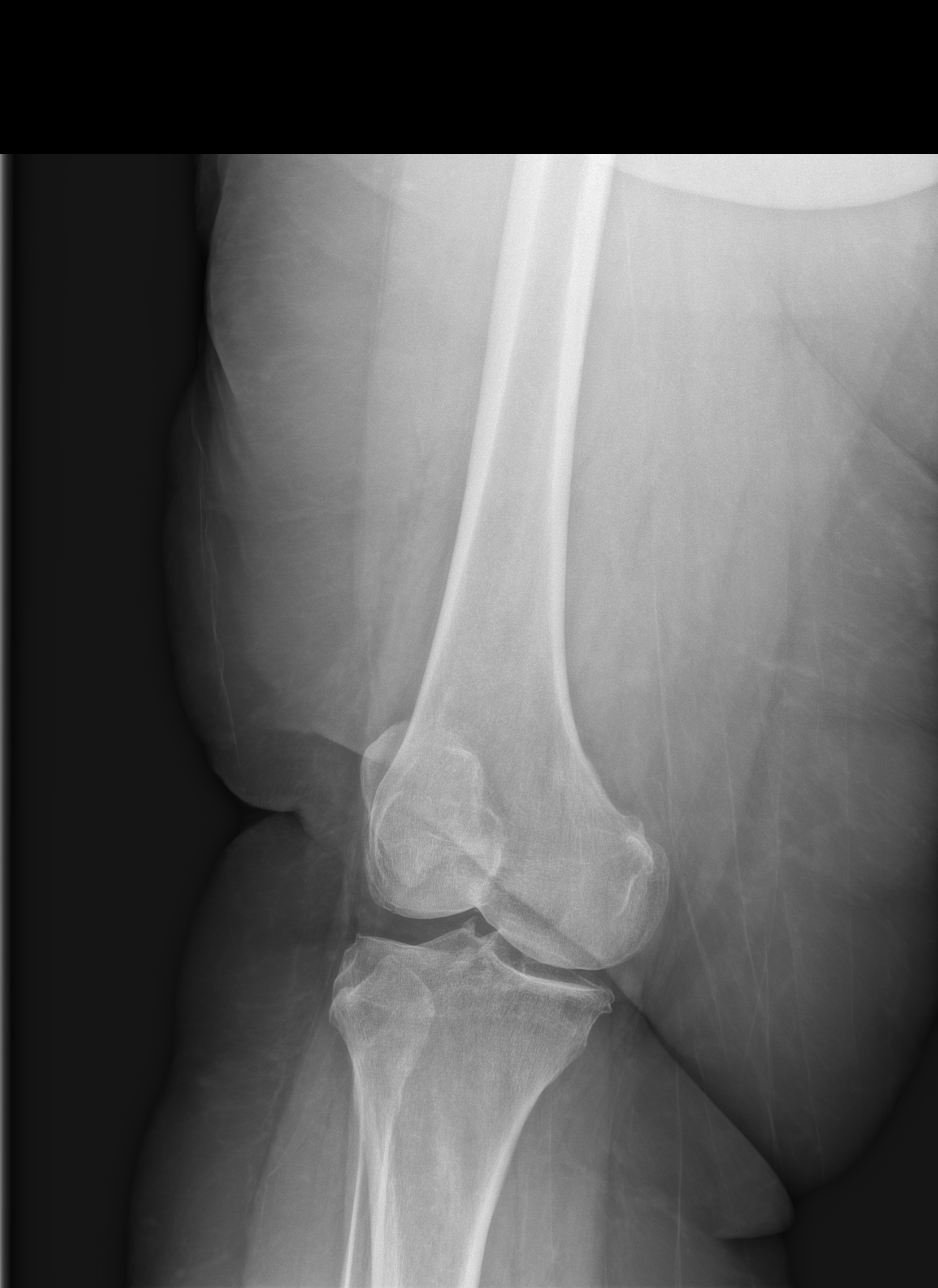

[w knee lat right]
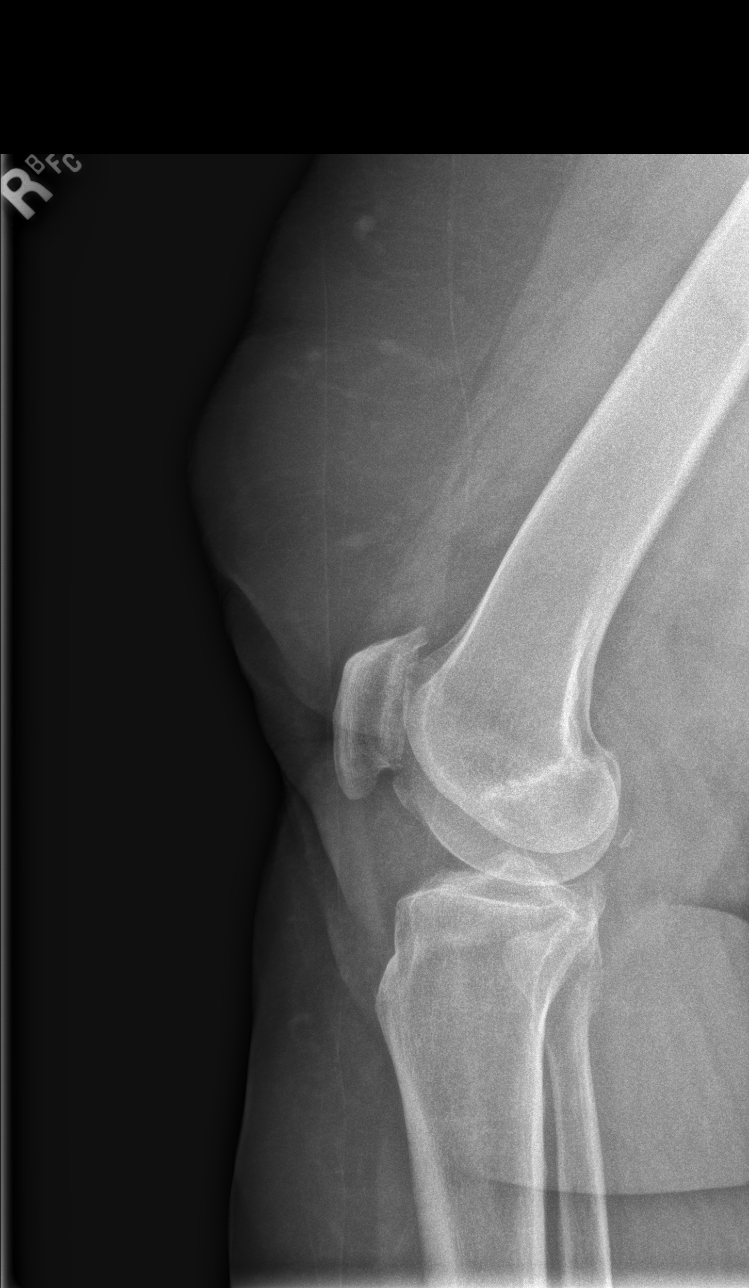

[w knee tunnel pa right]
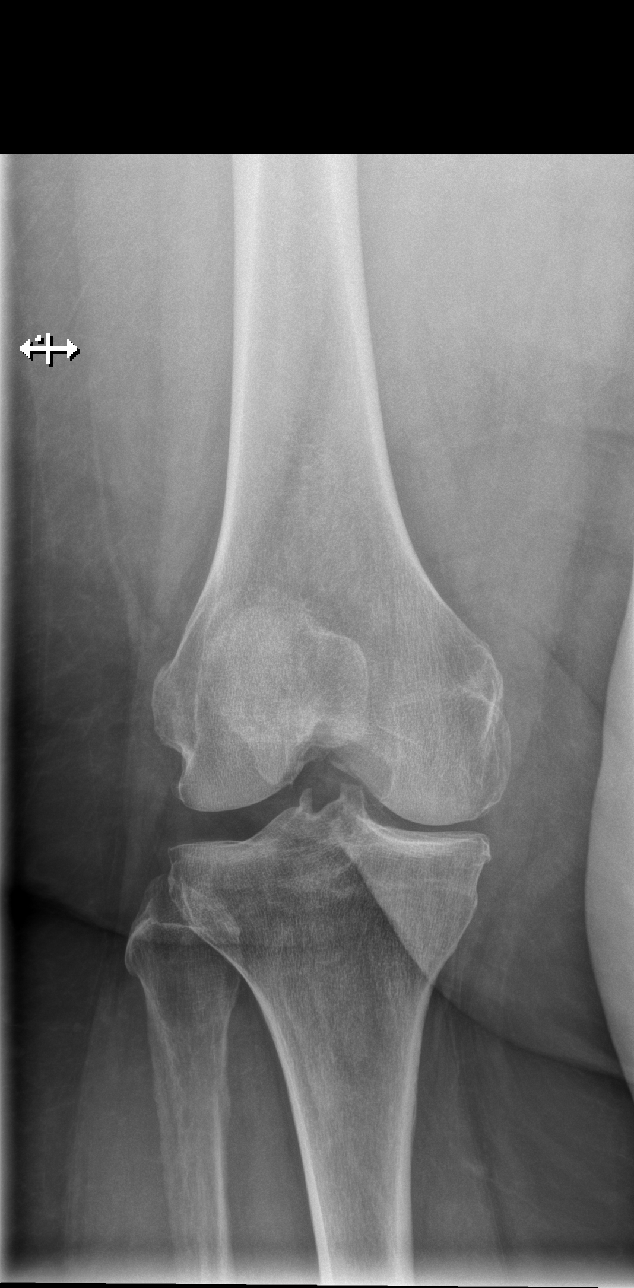

[x knee sunrise right]
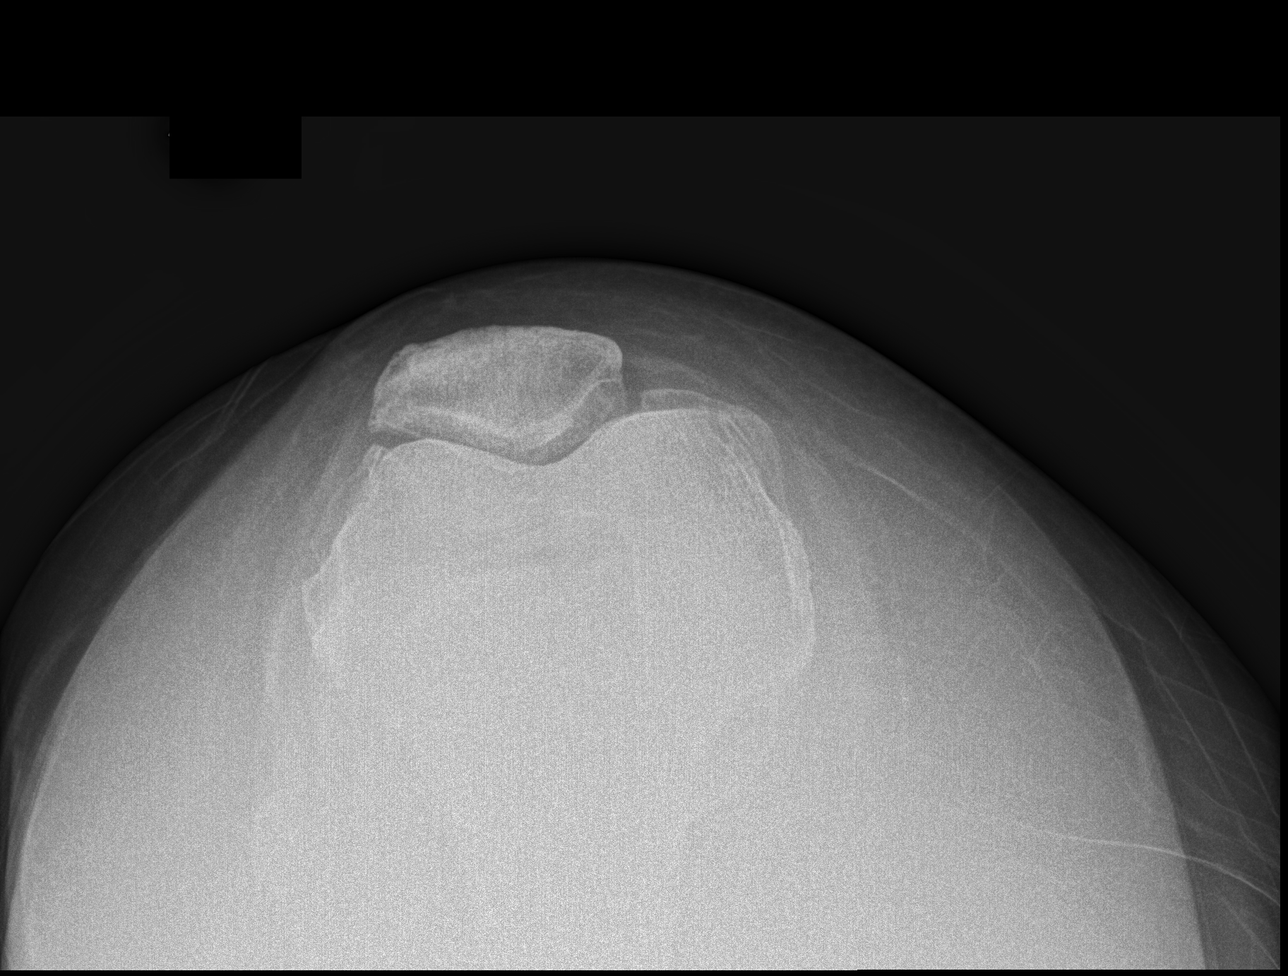

[4 of 4 positions shown; findings below may reference images not displayed]

FINDINGS: No fracture or bone lesion.

Mild medial joint space compartment narrowing. Small marginal
osteophytes all 3 compartments.

No joint effusion.  Surrounding soft tissues are unremarkable.
IMPRESSION: 1. No fracture or acute finding.
2. Mild osteoarthritis predominantly affecting the medial
compartment.

## 2020-09-17 ENCOUNTER — Other Ambulatory Visit (HOSPITAL_BASED_OUTPATIENT_CLINIC_OR_DEPARTMENT_OTHER): Payer: Self-pay

## 2020-09-17 MED ORDER — ESTRADIOL 0.1 MG/GM VA CREA: 1.0000 | TOPICAL_CREAM | VAGINAL | 1 refills | Status: DC

## 2020-09-17 NOTE — Telephone Encounter (Signed)
Patient has an appt. 12/05/2020. tbw

## 2020-10-16 DIAGNOSIS — Z23 Encounter for immunization: Secondary | ICD-10-CM | POA: Diagnosis not present

## 2020-11-01 DIAGNOSIS — Z23 Encounter for immunization: Secondary | ICD-10-CM | POA: Diagnosis not present

## 2020-11-27 DIAGNOSIS — H9201 Otalgia, right ear: Secondary | ICD-10-CM | POA: Diagnosis not present

## 2020-12-05 ENCOUNTER — Ambulatory Visit (INDEPENDENT_AMBULATORY_CARE_PROVIDER_SITE_OTHER): Payer: BC Managed Care – PPO | Admitting: Obstetrics & Gynecology

## 2020-12-05 ENCOUNTER — Other Ambulatory Visit (HOSPITAL_COMMUNITY)
Admission: RE | Admit: 2020-12-05 | Discharge: 2020-12-05 | Disposition: A | Discharge status: Home / Self Care | Payer: BC Managed Care – PPO | Source: Ambulatory Visit | Attending: Obstetrics & Gynecology | Admitting: Obstetrics & Gynecology

## 2020-12-05 ENCOUNTER — Encounter (HOSPITAL_BASED_OUTPATIENT_CLINIC_OR_DEPARTMENT_OTHER): Payer: Self-pay | Admitting: Obstetrics & Gynecology

## 2020-12-05 ENCOUNTER — Other Ambulatory Visit: Payer: Self-pay

## 2020-12-05 VITALS — BP 180/102 | HR 80 | Ht 63.0 in | Wt 263.0 lb

## 2020-12-05 DIAGNOSIS — Z01419 Encounter for gynecological examination (general) (routine) without abnormal findings: Secondary | ICD-10-CM | POA: Diagnosis not present

## 2020-12-05 DIAGNOSIS — Z124 Encounter for screening for malignant neoplasm of cervix: Secondary | ICD-10-CM | POA: Insufficient documentation

## 2020-12-05 DIAGNOSIS — Z90711 Acquired absence of uterus with remaining cervical stump: Secondary | ICD-10-CM | POA: Diagnosis not present

## 2020-12-05 DIAGNOSIS — N952 Postmenopausal atrophic vaginitis: Secondary | ICD-10-CM | POA: Diagnosis not present

## 2020-12-05 DIAGNOSIS — Z0000 Encounter for general adult medical examination without abnormal findings: Secondary | ICD-10-CM

## 2020-12-05 DIAGNOSIS — E559 Vitamin D deficiency, unspecified: Secondary | ICD-10-CM

## 2020-12-05 MED ORDER — VITAMIN D (ERGOCALCIFEROL) 1.25 MG (50000 UNIT) PO CAPS: ORAL_CAPSULE | ORAL | 4 refills | Status: AC

## 2020-12-05 MED ORDER — ESTRADIOL 0.1 MG/GM VA CREA: 1.0000 | TOPICAL_CREAM | VAGINAL | 1 refills | Status: AC

## 2020-12-05 NOTE — Progress Notes (Signed)
65 y.o. G1P1 Married Other or two or more races female here for annual exam.  Pt has been having issues with otitis.  Has been on Augmentin, Sudafed, mucinex, prednisone.  Is feeling better and ear is better.    She is aware her BP is elevated today.  It was checked multiple times.  Working Mon, [place] and Wed.  Can check BP at home and at work.    Raynelle Fanning, her daughter, had nephrectomy.  Was stage 1B or 2A.  Margins were all clear.  Genetic testing was all negative.  Did have a nephrectomy.  Patient's last menstrual period was 02/03/2004.          Sexually active: Yes.    The current method of family planning is status post hysterectomy.    Exercising: No.   Smoker:  no  Health Maintenance: Pap:  06/12/2016 Negative History of abnormal Pap:  abnormal pap smear prior to diagnosis of complex endometrial hyperplasia  MMG:  12/31/2019 Negative Colonoscopy:  03/18/2011 BMD:   09/20/2017 osteopenia Screening Labs: lab work done with Dr. Evlyn Kanner   reports that she has never smoked. She has never used smokeless tobacco. She reports current alcohol use. She reports that she does not use drugs.  Past Medical History:  Diagnosis Date   Anemia    Cervical fusion syndrome    DDD (degenerative disc disease), cervical    Endometrial hyperplasia with atypia    GI bleed 01/21/2016   History of Clostridium difficile 1995   hospitalized x 3 days   Hx of migraines    Madelung's deformity    right wrist   Shingles 3/10, 11/16   Vitamin D deficiency     Past Surgical History:  Procedure Laterality Date   ABDOMINAL HYSTERECTOMY  2006   supra cervix   CESAREAN SECTION     DILATION AND CURETTAGE OF UTERUS     TONSILLECTOMY     WISDOM TOOTH EXTRACTION      Current Outpatient Medications  Medication Sig Dispense Refill   acetaminophen (TYLENOL) 500 MG tablet Take 500 mg by mouth 2 (two) times daily. Back pain     estradiol (ESTRACE) 0.1 MG/GM vaginal cream Place 1 Applicatorful vaginally 3  (three) times a week. 42.5 g 1   methocarbamol (ROBAXIN) 500 MG tablet Take 500 mg by mouth 4 (four) times daily.     vitamin B-12 (CYANOCOBALAMIN) 1000 MCG tablet Take 1,000 mcg by mouth daily.     Vitamin D, Ergocalciferol, (DRISDOL) 1.25 MG (50000 UT) CAPS capsule TAKE 1 CAPSULE ONCE A WEEK. 12 capsule 4   No current facility-administered medications for this visit.    Family History  Problem Relation Age of Onset   Diabetes Maternal Uncle    Lung cancer Maternal Uncle        mets-renal cell carcinoma   Skin cancer Mother    Thyroid disease Mother        graves disease-now hypothyroid-treatment with radiation iodine   Dementia Mother        mild   Osteoporosis Mother    Pancreatic cancer Maternal Grandmother    Prostate cancer Paternal Grandfather    Hypertension Father    Heart attack Father        deceased age 76   Congestive Heart Failure Maternal Grandfather    COPD Maternal Grandfather    Congestive Heart Failure Maternal Uncle        CABG   Cancer Daughter     Review of Systems  All other systems reviewed and are negative.  Exam:   BP (!) 180/102 (Patient Position: Sitting, Cuff Size: Large)   Pulse 80   Ht 5\' 3"  (1.6 m)   Wt 263 lb (119.3 kg)   LMP 02/03/2004 Comment: supracx  BMI 46.59 kg/m   Height: 5\' 3"  (160 cm)  General appearance: alert, cooperative and appears stated age Head: Normocephalic, without obvious abnormality, atraumatic Neck: no adenopathy, supple, symmetrical, trachea midline and thyroid normal to inspection and palpation Lungs: clear to auscultation bilaterally Breasts: normal appearance, no masses or tenderness Heart: regular rate and rhythm Abdomen: soft, non-tender; bowel sounds normal; no masses,  no organomegaly Extremities: extremities normal, atraumatic, no cyanosis or edema Skin: Skin color, texture, turgor normal. No rashes or lesions Lymph nodes: Cervical, supraclavicular, and axillary nodes normal. No abnormal inguinal  nodes palpated Neurologic: Grossly normal   Pelvic: External genitalia:  no lesions              Urethra:  normal appearing urethra with no masses, tenderness or lesions              Bartholins and Skenes: normal                 Vagina: normal appearing vagina with normal color and no discharge, no lesions              Cervix: absent              Pap taken: No. Bimanual Exam:  Uterus:  uterus absent              Adnexa: no mass, fullness, tenderness               Rectovaginal: Confirms               Anus:  normal sphincter tone, no lesions  Chaperone, 04/02/2004, CMA, was present for exam.  Assessment/Plan: 1. Well woman exam with routine gynecological exam - pap and HR HPV obtained today - MMG scheduled 12/2020 - colonoscopy due 2023 - BMD 2019 - lab work done with Dr. 2024  2. S/P laparoscopic supracervical hysterectomy - Cytology - PAP( )  3. Vaginal atrophy - estradiol (ESTRACE) 0.1 MG/GM vaginal cream; Place 1 Applicatorful vaginally 3 (three) times a week.  Dispense: 42.5 g; Refill: 1  4. Vitamin D deficiency - Vitamin D, Ergocalciferol, (DRISDOL) 1.25 MG (50000 UNIT) CAPS capsule; TAKE 1 CAPSULE ONCE A WEEK.  Dispense: 12 capsule; Refill: 4  5.  Elevated BP on medication for otitis

## 2020-12-10 LAB — CYTOLOGY - PAP
Comment: NEGATIVE
Diagnosis: NEGATIVE
High risk HPV: NEGATIVE

## 2020-12-18 ENCOUNTER — Telehealth (HOSPITAL_BASED_OUTPATIENT_CLINIC_OR_DEPARTMENT_OTHER): Payer: Self-pay | Admitting: *Deleted

## 2020-12-18 NOTE — Telephone Encounter (Signed)
Pt wanted to let Dr. Hyacinth Meeker know that since she had been off the sinus medication, her BPs have improved. She reports readings of 140/80, 120/76, and 124/84.

## 2021-01-08 DIAGNOSIS — H43811 Vitreous degeneration, right eye: Secondary | ICD-10-CM | POA: Diagnosis not present

## 2021-01-08 DIAGNOSIS — H43822 Vitreomacular adhesion, left eye: Secondary | ICD-10-CM | POA: Diagnosis not present

## 2021-01-08 DIAGNOSIS — H3561 Retinal hemorrhage, right eye: Secondary | ICD-10-CM | POA: Diagnosis not present

## 2021-01-08 DIAGNOSIS — H15831 Staphyloma posticum, right eye: Secondary | ICD-10-CM | POA: Diagnosis not present

## 2021-02-07 DIAGNOSIS — Z1231 Encounter for screening mammogram for malignant neoplasm of breast: Secondary | ICD-10-CM | POA: Diagnosis not present

## 2021-02-10 ENCOUNTER — Encounter (HOSPITAL_BASED_OUTPATIENT_CLINIC_OR_DEPARTMENT_OTHER): Payer: Self-pay | Admitting: *Deleted

## 2021-02-21 DIAGNOSIS — H43822 Vitreomacular adhesion, left eye: Secondary | ICD-10-CM | POA: Diagnosis not present

## 2021-02-21 DIAGNOSIS — H43811 Vitreous degeneration, right eye: Secondary | ICD-10-CM | POA: Diagnosis not present

## 2021-02-21 DIAGNOSIS — H3561 Retinal hemorrhage, right eye: Secondary | ICD-10-CM | POA: Diagnosis not present

## 2021-02-21 DIAGNOSIS — H15831 Staphyloma posticum, right eye: Secondary | ICD-10-CM | POA: Diagnosis not present

## 2021-04-09 ENCOUNTER — Encounter: Payer: Self-pay | Admitting: Internal Medicine

## 2021-08-12 IMAGING — CT CT ABD-PELV W/ CM
1 of 3 series · 12 of 32 positions shown, 17 images · IV contrast (APPLIED)
Comparison: 03/17/2004
COMPARISON: 03/17/2004

Addendum:
CLINICAL DATA: Left lower quadrant pain with rectal bleeding and
nausea 3 days.

EXAM:
CT ABDOMEN AND PELVIS WITH CONTRAST
TECHNIQUE: Multidetector CT imaging of the abdomen and pelvis was performed
using the standard protocol following bolus administration of
intravenous contrast.
Creatinine was obtained on site at [HOSPITAL] at [HOSPITAL].
Results: Creatinine 1.1 mg/dL.  GFR 56.
CONTRAST:  100mL LCBGRS-2QQ IOPAMIDOL (LCBGRS-2QQ) INJECTION 61%

[Series 2: abd/pelvis w/cm · axial · 0.92mm/px · z∈[-466,-56]mm · 12 of 96 slices shown, 17 images]
[im 7/96  soft-tissue]
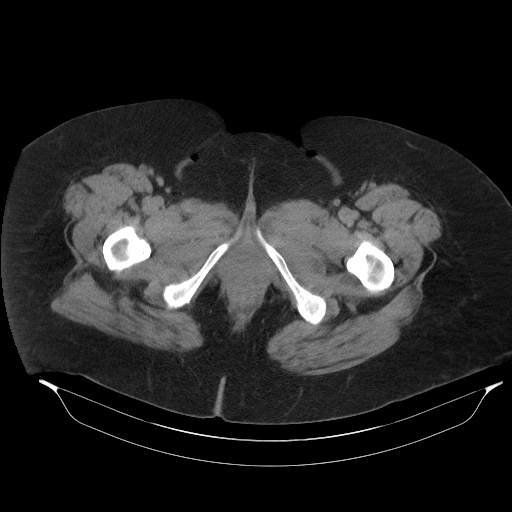
[im 7/96  bone]
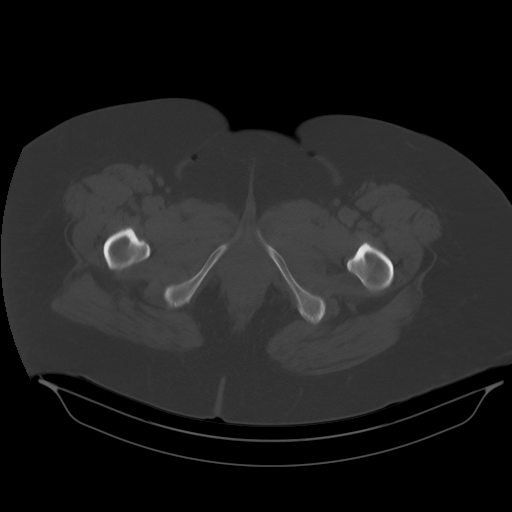
[im 13/96  soft-tissue]
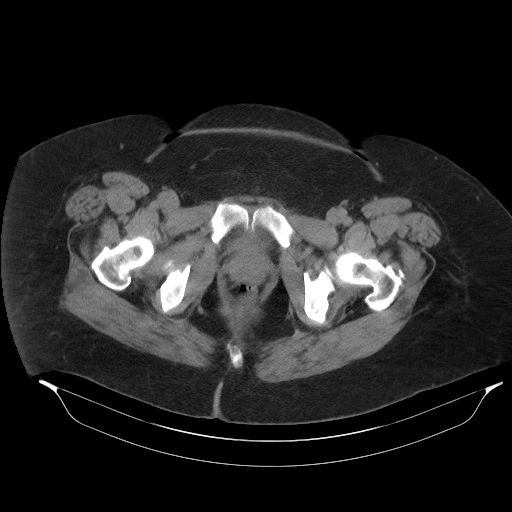
[im 26/96  soft-tissue]
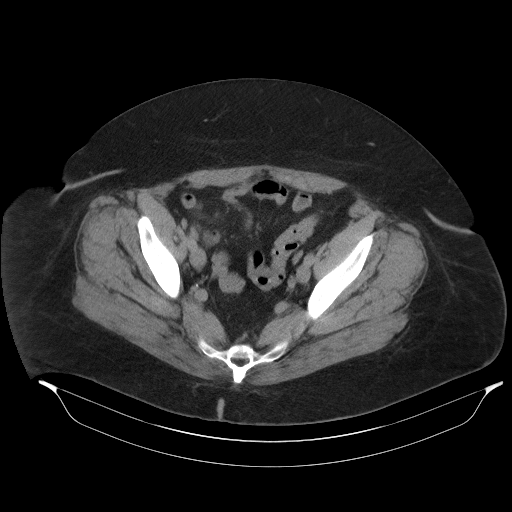
[im 32/96  soft-tissue]
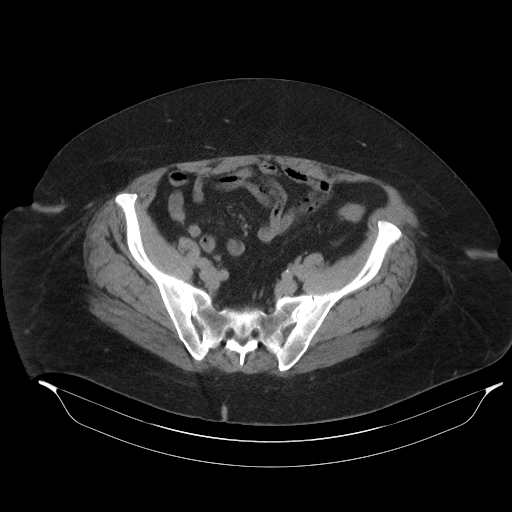
[im 39/96  soft-tissue]
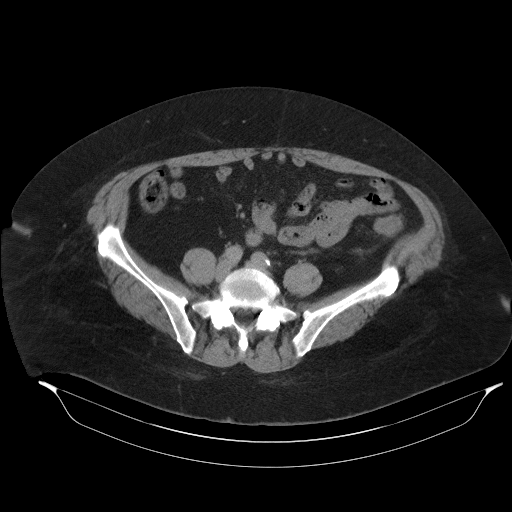
[im 51/96  soft-tissue]
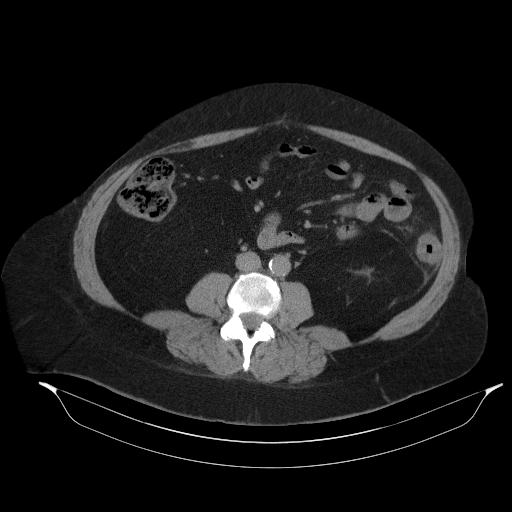
[im 58/96  soft-tissue]
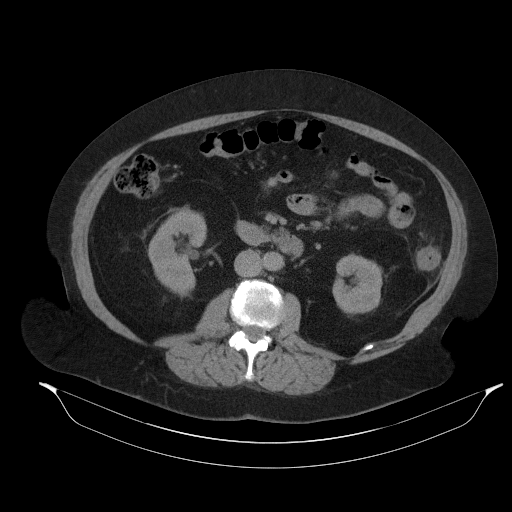
[im 64/96  soft-tissue]
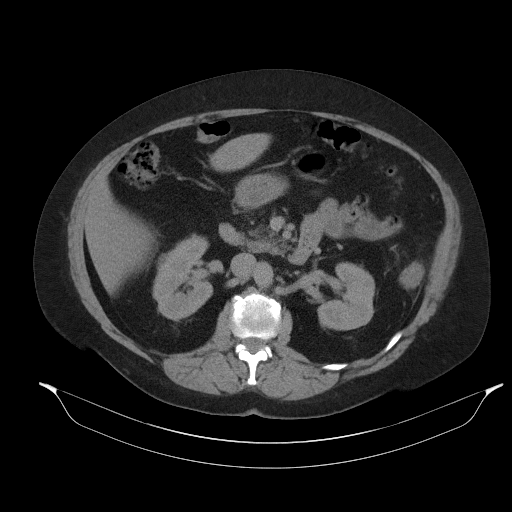
[im 70/96  soft-tissue]
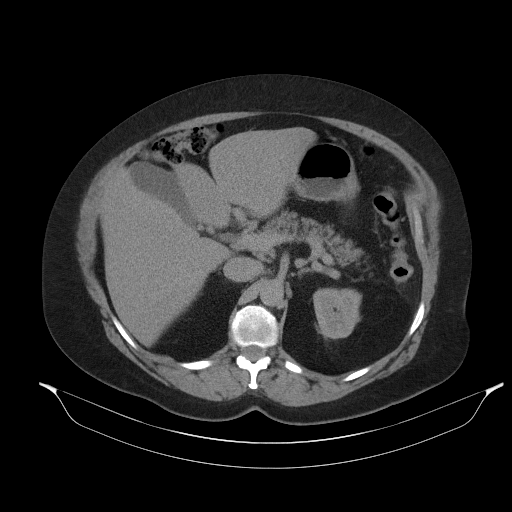
[im 70/96  lung]
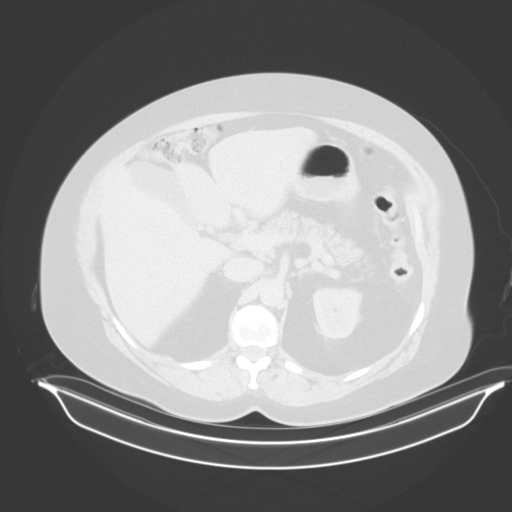
[im 70/96  bone]
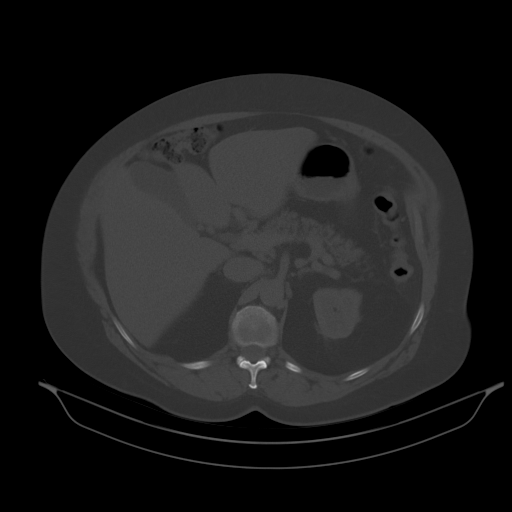
[im 77/96  lung]
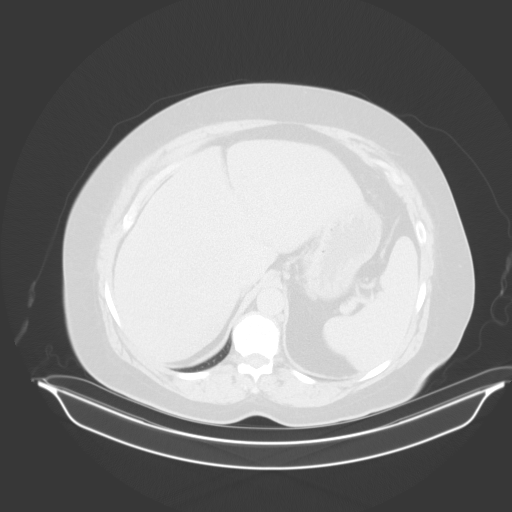
[im 83/96  soft-tissue]
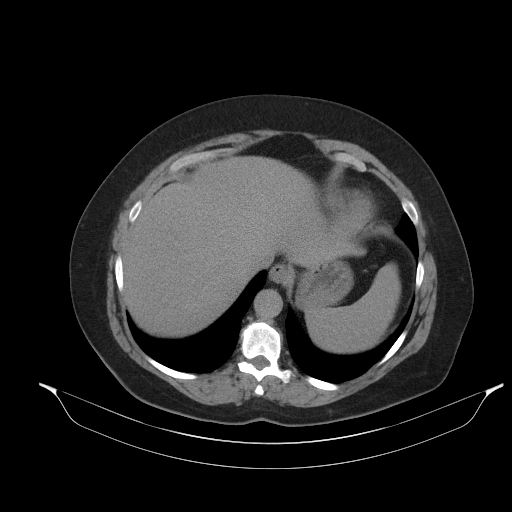
[im 83/96  lung]
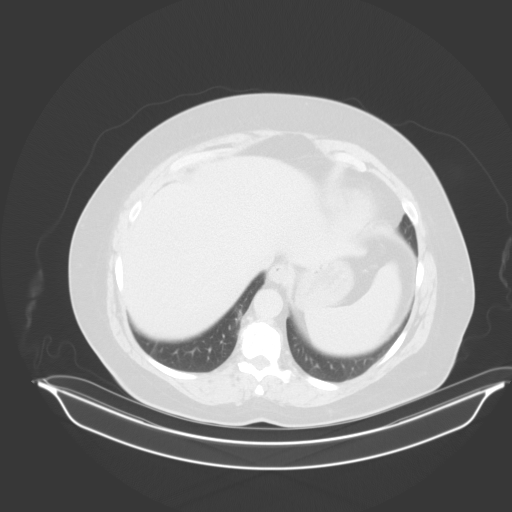
[im 89/96  soft-tissue]
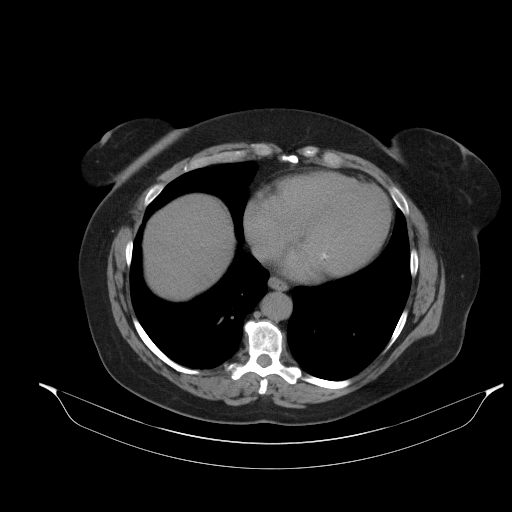
[im 89/96  lung]
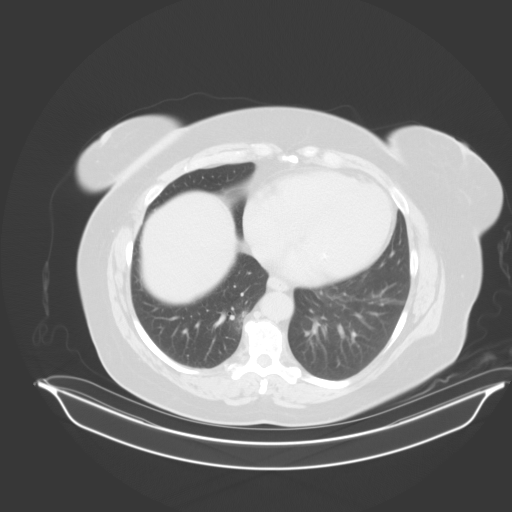

[12 of 32 positions shown; findings below may reference images not displayed]

FINDINGS: Lower chest: Lung bases are clear. Mild calcification over the
mitral valve annulus.

Hepatobiliary: Liver, gallbladder and biliary tree are normal.

Pancreas: Normal.

Spleen: Normal.

Adrenals/Urinary Tract: Adrenal glands are normal. Kidneys are
normal in size without hydronephrosis or nephrolithiasis.
Subcentimeter hypodensity over the mid pole of the right kidney too
small to characterize but likely a cyst. Ureters and bladder are
normal.

Stomach/Bowel: Stomach and small bowel are normal. Appendix is
normal. Uniform circumferential wall thickening throughout the
descending colon with mild adjacent pericolonic inflammatory
change/fluid. Findings are compatible with an acute colitis likely
infectious or inflammatory nature. Minimal diverticulosis of the
colon.

Vascular/Lymphatic: Mild calcified plaque over the abdominal aorta
without evidence of aneurysm. No adenopathy.

Reproductive: Previous hysterectomy.  Adnexal regions unremarkable.

Other: None.

Musculoskeletal: No focal abnormality.
IMPRESSION: 1. Evidence of acute colitis involving the descending colon likely
infectious or inflammatory in nature.
2. Minimal colonic diverticulosis.
3. Subcentimeter right renal cortical hypodensity too small to
characterize, but likely a cyst.
4. Aortic atherosclerosis.

Aortic Atherosclerosis (YA9RL-A6Z.Z).

ADDENDUM:
The patient returned with increasing pain in the left forearm.
Partial contrast extravasation was noted at the time of the exam.
She has superficial soft tissue swelling over the ventral aspect of
the left forearm with some blanching of the skin. Distal capillary
refill is within normal limits in fingers of the left hand. She has
normal sensation and grip strength. She denies any paresthesias.

Recommend ice and elevation. [HOSPITAL] nursing staff will
contact for tomorrow. She is to go to the emergency room with any
increasing pain or paresthesias.

At this point she does not have a compartment syndrome.

*** End of Addendum ***
FINDINGS: Lower chest: Lung bases are clear. Mild calcification over the
mitral valve annulus.

Hepatobiliary: Liver, gallbladder and biliary tree are normal.

Pancreas: Normal.

Spleen: Normal.

Adrenals/Urinary Tract: Adrenal glands are normal. Kidneys are
normal in size without hydronephrosis or nephrolithiasis.
Subcentimeter hypodensity over the mid pole of the right kidney too
small to characterize but likely a cyst. Ureters and bladder are
normal.

Stomach/Bowel: Stomach and small bowel are normal. Appendix is
normal. Uniform circumferential wall thickening throughout the
descending colon with mild adjacent pericolonic inflammatory
change/fluid. Findings are compatible with an acute colitis likely
infectious or inflammatory nature. Minimal diverticulosis of the
colon.

Vascular/Lymphatic: Mild calcified plaque over the abdominal aorta
without evidence of aneurysm. No adenopathy.

Reproductive: Previous hysterectomy.  Adnexal regions unremarkable.

Other: None.

Musculoskeletal: No focal abnormality.
IMPRESSION: 1. Evidence of acute colitis involving the descending colon likely
infectious or inflammatory in nature.
2. Minimal colonic diverticulosis.
3. Subcentimeter right renal cortical hypodensity too small to
characterize, but likely a cyst.
4. Aortic atherosclerosis.

Aortic Atherosclerosis (YA9RL-A6Z.Z).

## 2021-08-22 DIAGNOSIS — H43822 Vitreomacular adhesion, left eye: Secondary | ICD-10-CM | POA: Diagnosis not present

## 2021-08-22 DIAGNOSIS — H15831 Staphyloma posticum, right eye: Secondary | ICD-10-CM | POA: Diagnosis not present

## 2021-08-22 DIAGNOSIS — H2513 Age-related nuclear cataract, bilateral: Secondary | ICD-10-CM | POA: Diagnosis not present

## 2021-08-22 DIAGNOSIS — H43811 Vitreous degeneration, right eye: Secondary | ICD-10-CM | POA: Diagnosis not present

## 2021-09-10 ENCOUNTER — Other Ambulatory Visit (HOSPITAL_COMMUNITY): Payer: Self-pay | Admitting: Endocrinology

## 2021-09-10 DIAGNOSIS — R030 Elevated blood-pressure reading, without diagnosis of hypertension: Secondary | ICD-10-CM | POA: Diagnosis not present

## 2021-09-10 DIAGNOSIS — R011 Cardiac murmur, unspecified: Secondary | ICD-10-CM

## 2021-09-15 ENCOUNTER — Ambulatory Visit (HOSPITAL_COMMUNITY): Payer: BC Managed Care – PPO | Attending: Endocrinology

## 2021-09-15 DIAGNOSIS — R011 Cardiac murmur, unspecified: Secondary | ICD-10-CM

## 2021-09-15 LAB — ECHOCARDIOGRAM COMPLETE
AR max vel: 1.55 cm2
AV Area VTI: 1.57 cm2
AV Area mean vel: 1.45 cm2
AV Mean grad: 16 mmHg
AV Peak grad: 29.2 mmHg
Ao pk vel: 2.7 m/s
Area-P 1/2: 2.84 cm2
S' Lateral: 2.8 cm

## 2021-10-09 DIAGNOSIS — M1711 Unilateral primary osteoarthritis, right knee: Secondary | ICD-10-CM | POA: Diagnosis not present

## 2022-02-13 DIAGNOSIS — Z1231 Encounter for screening mammogram for malignant neoplasm of breast: Secondary | ICD-10-CM | POA: Diagnosis not present

## 2022-02-17 ENCOUNTER — Encounter (HOSPITAL_BASED_OUTPATIENT_CLINIC_OR_DEPARTMENT_OTHER): Payer: Self-pay | Admitting: *Deleted

## 2022-03-09 DIAGNOSIS — L738 Other specified follicular disorders: Secondary | ICD-10-CM | POA: Diagnosis not present

## 2022-03-09 DIAGNOSIS — L720 Epidermal cyst: Secondary | ICD-10-CM | POA: Diagnosis not present

## 2022-03-09 DIAGNOSIS — L820 Inflamed seborrheic keratosis: Secondary | ICD-10-CM | POA: Diagnosis not present

## 2022-03-09 DIAGNOSIS — L821 Other seborrheic keratosis: Secondary | ICD-10-CM | POA: Diagnosis not present

## 2022-06-25 ENCOUNTER — Other Ambulatory Visit (HOSPITAL_COMMUNITY): Payer: Self-pay | Admitting: Endocrinology

## 2022-06-25 ENCOUNTER — Ambulatory Visit (HOSPITAL_COMMUNITY)
Admission: RE | Admit: 2022-06-25 | Discharge: 2022-06-25 | Disposition: A | Discharge status: Home / Self Care | Payer: BC Managed Care – PPO | Source: Ambulatory Visit | Attending: Surgery | Admitting: Surgery

## 2022-06-25 DIAGNOSIS — R52 Pain, unspecified: Secondary | ICD-10-CM

## 2022-09-04 DIAGNOSIS — M1711 Unilateral primary osteoarthritis, right knee: Secondary | ICD-10-CM | POA: Diagnosis not present

## 2022-10-16 DIAGNOSIS — H2513 Age-related nuclear cataract, bilateral: Secondary | ICD-10-CM | POA: Diagnosis not present

## 2022-10-16 DIAGNOSIS — H43811 Vitreous degeneration, right eye: Secondary | ICD-10-CM | POA: Diagnosis not present

## 2022-10-16 DIAGNOSIS — H33193 Other retinoschisis and retinal cysts, bilateral: Secondary | ICD-10-CM | POA: Diagnosis not present

## 2022-10-16 DIAGNOSIS — H43822 Vitreomacular adhesion, left eye: Secondary | ICD-10-CM | POA: Diagnosis not present

## 2022-10-28 DIAGNOSIS — Z23 Encounter for immunization: Secondary | ICD-10-CM | POA: Diagnosis not present

## 2023-03-10 DIAGNOSIS — I10 Essential (primary) hypertension: Secondary | ICD-10-CM | POA: Diagnosis not present

## 2023-03-10 DIAGNOSIS — R002 Palpitations: Secondary | ICD-10-CM | POA: Diagnosis not present

## 2023-03-11 ENCOUNTER — Other Ambulatory Visit: Payer: Self-pay | Admitting: *Deleted

## 2023-03-11 DIAGNOSIS — R000 Tachycardia, unspecified: Secondary | ICD-10-CM

## 2023-03-11 DIAGNOSIS — I455 Other specified heart block: Secondary | ICD-10-CM

## 2023-03-20 DIAGNOSIS — I455 Other specified heart block: Secondary | ICD-10-CM

## 2023-03-20 DIAGNOSIS — R000 Tachycardia, unspecified: Secondary | ICD-10-CM

## 2023-03-21 DIAGNOSIS — R000 Tachycardia, unspecified: Secondary | ICD-10-CM | POA: Diagnosis not present

## 2023-03-21 DIAGNOSIS — I455 Other specified heart block: Secondary | ICD-10-CM | POA: Diagnosis not present

## 2023-04-20 ENCOUNTER — Ambulatory Visit: Attending: Endocrinology

## 2023-04-20 DIAGNOSIS — R000 Tachycardia, unspecified: Secondary | ICD-10-CM

## 2023-04-20 DIAGNOSIS — I455 Other specified heart block: Secondary | ICD-10-CM

## 2023-06-02 DIAGNOSIS — E559 Vitamin D deficiency, unspecified: Secondary | ICD-10-CM | POA: Diagnosis not present

## 2023-06-02 DIAGNOSIS — Z79899 Other long term (current) drug therapy: Secondary | ICD-10-CM | POA: Diagnosis not present

## 2023-06-02 DIAGNOSIS — I10 Essential (primary) hypertension: Secondary | ICD-10-CM | POA: Diagnosis not present

## 2023-06-03 DIAGNOSIS — M1711 Unilateral primary osteoarthritis, right knee: Secondary | ICD-10-CM | POA: Diagnosis not present

## 2023-06-13 DIAGNOSIS — R002 Palpitations: Secondary | ICD-10-CM | POA: Diagnosis not present

## 2023-06-13 DIAGNOSIS — R079 Chest pain, unspecified: Secondary | ICD-10-CM | POA: Diagnosis not present

## 2023-06-13 DIAGNOSIS — I480 Paroxysmal atrial fibrillation: Secondary | ICD-10-CM | POA: Diagnosis not present

## 2023-07-23 DIAGNOSIS — Z1231 Encounter for screening mammogram for malignant neoplasm of breast: Secondary | ICD-10-CM | POA: Diagnosis not present

## 2023-07-30 ENCOUNTER — Encounter (HOSPITAL_BASED_OUTPATIENT_CLINIC_OR_DEPARTMENT_OTHER): Payer: Self-pay | Admitting: Obstetrics & Gynecology

## 2023-08-11 DIAGNOSIS — E785 Hyperlipidemia, unspecified: Secondary | ICD-10-CM | POA: Diagnosis not present

## 2023-08-12 ENCOUNTER — Encounter: Payer: Self-pay | Admitting: Cardiology

## 2023-08-12 ENCOUNTER — Ambulatory Visit: Attending: Cardiology | Admitting: Cardiology

## 2023-08-12 VITALS — BP 116/80 | HR 99 | Ht 63.0 in | Wt 245.4 lb

## 2023-08-12 DIAGNOSIS — R000 Tachycardia, unspecified: Secondary | ICD-10-CM | POA: Diagnosis not present

## 2023-08-12 DIAGNOSIS — R002 Palpitations: Secondary | ICD-10-CM | POA: Diagnosis not present

## 2023-08-12 DIAGNOSIS — R0683 Snoring: Secondary | ICD-10-CM

## 2023-08-12 DIAGNOSIS — I350 Nonrheumatic aortic (valve) stenosis: Secondary | ICD-10-CM

## 2023-08-12 DIAGNOSIS — R29818 Other symptoms and signs involving the nervous system: Secondary | ICD-10-CM | POA: Diagnosis not present

## 2023-08-12 NOTE — Patient Instructions (Addendum)
 Medication Instructions:   Your physician recommends that you continue on your current medications as directed. Please refer to the Current Medication list given to you today.  *If you need a refill on your cardiac medications before your next appointment, please call your pharmacy*    You have been referred to SEE OUR ELECTROPHYSIOLOGIST FOR CONSIDERATION OF INTERNAL LOOP RECORDER--YOUR APPOINTMENT IS SCHEDULED FOR 8/11 AT 8 AM WITH DR. PARKER    Testing/Procedures:  Your physician has requested that you have an echocardiogram. Echocardiography is a painless test that uses sound waves to create images of your heart. It provides your doctor with information about the size and shape of your heart and how well your heart's chambers and valves are working. This procedure takes approximately one hour. There are no restrictions for this procedure. Please do NOT wear cologne, perfume, aftershave, or lotions (deodorant is allowed). Please arrive 15 minutes prior to your appointment time.  Please note: We ask at that you not bring children with you during ultrasound (echo/ vascular) testing. Due to room size and safety concerns, children are not allowed in the ultrasound rooms during exams. Our front office staff cannot provide observation of children in our lobby area while testing is being conducted. An adult accompanying a patient to their appointment will only be allowed in the ultrasound room at the discretion of the ultrasound technician under special circumstances. We apologize for any inconvenience.   Your physician has recommended that you have a [facility] HOME sleep study. This test records several body functions during sleep, including: brain activity, eye movement, oxygen and carbon dioxide blood levels, heart rate and rhythm, breathing rate and rhythm, the flow of air through your mouth and nose, snoring, body muscle movements, and chest and belly movement.  YOU WILL BE PROVIDED THE DEVICE  WITH INSTRUCTIONS BY OUR SLEEP TEAM TODAY IN THE CLINIC   Follow-Up:  WITH DR. PARKER ELECTROPHYSIOLOGIST ON 09/13/23 AT 8 AM

## 2023-08-12 NOTE — Progress Notes (Signed)
 Cardiology Office Note:   Date:  08/12/2023  ID:  Judith Salazar, DOB 09-20-1955, MRN [id number] PCP: Nichole Senior, MD  [facility] Providers Cardiologist:  None    History of Present Illness:   Discussed the use of AI scribe software for clinical note transcription with the patient, who gave verbal consent to proceed.  History of Present Illness Judith Salazar is a 68 year old female who presents with palpitations. She was referred for evaluation of palpitations and possible atrial fibrillation. 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   She has been experiencing more frequent palpitations for the past several months, initially noticed in February while at work. The sensation is described as 'skipping some beats' without associated dizziness or shortness of breath. An EKG at that time did not reveal significant arrhythmia, leading to a 30-day heart monitor which also did not reveal any sustained arrhythmia and saw no evidence of afib.  In May, she experienced an episode of palpitations while on vacation in [place], with her heart rate reaching the 120s. The symptoms, lasted about an hour and a half and resolved spontaneously after rest and hydration. The following morning, she had recurrent palpitations and given planned flight later that day, went to the emergency department at [facility] for evaluation. Per notes at [facility] ED, While in the room taking HPI, observed run of A-fib with RVR. This was captured on her telemetry in the department. Ran EKG rhythm strip that did not show any significant conduction delay or other arrhythmia. Workup otherwise benign including imaging and labs. She was started on metoprolol succinate 50 mg and Eliquis during the emergency department visit. She reports nocturnal episodes of increased heart rate, particularly between 4 and 6 AM, with rates reaching 100-110  bpm. No dizziness or significant symptoms during these episodes. She has been less active since returning from [place] due to concerns about triggering arrhythmias.   Today in clinic, patient has a video of above tachy-arrhythmia. Today episode is very brief with no more than ~15 fast beats, regular R to R interval on my visualization.  In addition to more recent concerns of palpitations/questionable afib, she has a history of a heart murmur, which she describes as being more noticeable after treatment for a blocked ear with prednisone and antihistamines. Denies shortness of breath, swelling, or significant exertional symptoms. Her heart rate during work is typically between 60 and 80 bpm.  Studies Reviewed:    EKG:   EKG Interpretation Date/Time:  Thursday August 12 2023 08:18:45 EDT Ventricular Rate:  99 PR Interval:  146 QRS Duration:  88 QT Interval:  354 QTC Calculation: 454 R Axis:   -18  Text Interpretation: Normal sinus rhythm Inferior infarct , age undetermined Anterior infarct , age undetermined No previous ECGs available Confirmed by Trudy Birmingham [phone]) on 08/12/2023 8:37:17 AM    04/20/23 30 day event monitor  HR 46 - 130, average 76 bpm.   No atrial fibrillation detected. Rare supraventricular ectopy. Rare ventricular ectopy. No sustained arrhythmias. No symptom trigger episodes.  09/15/2021 TTE  IMPRESSIONS     1. Left ventricular ejection fraction, by estimation, is 60 to 65%. The  left ventricle has normal function. The left ventricle has no regional  wall motion abnormalities. There is moderate concentric left ventricular  hypertrophy. Left ventricular  diastolic parameters are consistent with Grade I diastolic dysfunction  (impaired relaxation).   2. Right ventricular systolic function  is normal. The right ventricular  size is normal. There is normal pulmonary artery systolic pressure.   3. The mitral valve is normal in structure. Mild mitral valve   regurgitation. No evidence of mitral stenosis.   4. The aortic valve is tricuspid. There is mild calcification of the  aortic valve. There is mild thickening of the aortic valve. Aortic valve  regurgitation is mild. Mild aortic valve stenosis. Aortic valve area, by  VTI measures 1.57 cm. Aortic valve  mean gradient measures 16.0 mmHg. Aortic valve Vmax measures 2.70 m/s.   5. There is borderline dilatation of the ascending aorta, measuring 36  mm.   6. The inferior vena cava is normal in size with greater than 50%  respiratory variability, suggesting right atrial pressure of 3 mmHg.   Risk Assessment/Calculations:         STOP-Bang Score:  5       Physical Exam:   VS:  BP 116/80   Pulse 99   Ht 5' 3 (1.6 m)   Wt 245 lb 6.4 oz (111.3 kg)   LMP 02/03/2004 Comment: supracx  SpO2 96%   BMI 43.47 kg/m    Wt Readings from Last 3 Encounters:  08/12/23 245 lb 6.4 oz (111.3 kg)  12/05/20 263 lb (119.3 kg)  12/20/19 254 lb (115.2 kg)     Physical Exam Vitals reviewed.  Constitutional:      Appearance: Normal appearance.  HENT:     Head: Normocephalic.     Nose: Nose normal.  Eyes:     Pupils: Pupils are equal, round, and reactive to light.  Cardiovascular:     Rate and Rhythm: Normal rate and regular rhythm.     Pulses: Normal pulses.     Heart sounds: Murmur (2/6 at RUSB) heard.     No friction rub. No gallop.  Pulmonary:     Effort: Pulmonary effort is normal.     Breath sounds: Normal breath sounds.  Abdominal:     General: Abdomen is flat.  Musculoskeletal:     Right lower leg: No edema.     Left lower leg: No edema.  Skin:    General: Skin is warm and dry.     Capillary Refill: Capillary refill takes less than 2 seconds.  Neurological:     General: No focal deficit present.     Mental Status: She is alert and oriented to person, place, and time.  Psychiatric:        Mood and Affect: Mood normal.        Behavior: Behavior normal.        Thought Content:  Thought content normal.        Judgment: Judgment normal.      ASSESSMENT AND PLAN:    Assessment & Plan Palpitations Intermittent palpitations with episodes of tachycardia, possibly paroxysmal atrial fibrillation or atrial flutter (though not formally documented on any saved rhythm strips or ECG). Recent episodes of tachycardia while traveling in [place] were self limited but prompted visit to the ED. While there, per notes, ED provider believed he saw a very brief run of afib on patient's telemetry. She has a video of this event that to me looks more like SVT vs atrial flutter. The episode is so brief that it's difficult to fully discern what the arrhythmia is. Previous 30-day monitor showed no atrial fibrillation, only PACs and PVCs. Patient has recently started wearing an Apple Watch with no detected afib thus far. Differential includes supraventricular tachycardia,  atrial arrhythmia, atrial fibrillation, and atrial flutter. No significant symptoms such as dizziness or syncope. - Order repeat echocardiogram to assess for structural heart changes. - Order sleep study to evaluate for sleep apnea, which may be associated with arrhythmias. - While no clearly documented afib/flutter, patient was started on Eliquis at [facility] ED. Discussed risk vs benefits of discontinuing given this, shared decision to continue for now. Current CHADS2VASc (assuming afib) is 3, indicating annual stroke risk 3.2%.Given lack of clear documented afib/flutter, would strongly consider ILR for long-term monitoring to better determine long-term indication for [facility]. Referral placed to EP. - Continue metoprolol since currently tolerated, as it may help with rate control and PACs/PVCs (patient has not really noticed a difference yet).   Mild MR Long-standing heart murmur. On exam ,she has a right upper sternal border systolic murmur, 2/6 in intensity. No significant symptoms such as exertional dyspnea or orthopnea. Previous  echocardiogram showed only mild MR with mild aortic valve stenosis. - Order repeat echocardiogram to assess for any changes in valvular function.  Hypertension Hypertension, currently well managed. No changes to therapy.  Follow-up Follow-up with electrophysiology team for further evaluation and management of arrhythmias/consideration of ILR.  - Schedule follow-up with electrophysiology team. - Coordinate sleep study and echocardiogram appointments.         Signed, Artist Pouch, PA-C

## 2023-08-12 NOTE — Progress Notes (Deleted)
  Cardiology Office Note:   Date:  08/12/2023  ID:  Judith Salazar, DOB 09-30-1955, MRN [id number] PCP: Nichole Senior, MD  [facility] Providers Cardiologist:  None { Click to update primary MD,subspecialty MD or APP then REFRESH:1}   History of Present Illness:   Judith Salazar is a 68 y.o. female ***  Discussed the use of AI scribe software for clinical note transcription with the patient, who gave verbal consent to proceed.  History of Present Illness      Today patient denies chest pain, shortness of breath, lower extremity edema, fatigue, palpitations, melena, hematuria, hemoptysis, diaphoresis, weakness, presyncope, syncope, orthopnea, and PND.   Studies Reviewed:    EKG:        ***  Risk Assessment/Calculations:   {Does this patient have ATRIAL FIBRILLATION?[phone]}          Physical Exam:   VS:  BP 116/80   Pulse 99   Ht 5' 3 (1.6 m)   Wt 245 lb 6.4 oz (111.3 kg)   LMP 02/03/2004 Comment: supracx  SpO2 96%   BMI 43.47 kg/m    Wt Readings from Last 3 Encounters:  08/12/23 245 lb 6.4 oz (111.3 kg)  12/05/20 263 lb (119.3 kg)  12/20/19 254 lb (115.2 kg)     Physical Exam  Physical Exam    ASSESSMENT AND PLAN:     Assessment and Plan Assessment & Plan          {Are you ordering a CV Procedure (e.g. stress test, cath, DCCV, TEE, etc)?   Press F2        :[id number]}   Signed, Artist Pouch, PA-C

## 2023-08-12 NOTE — Progress Notes (Signed)
 Patient agreement reviewed and signed on 08/12/2023.  WatchPAT issued to patient on 08/12/2023 by Connell JAYSON Boers, LPN. Patient aware to not open the WatchPAT box until contacted with the activation PIN. Patient profile initialized in CloudPAT on 08/12/2023 by Connell Boers, LPN. Device serial number: [id number]

## 2023-08-13 DIAGNOSIS — H33193 Other retinoschisis and retinal cysts, bilateral: Secondary | ICD-10-CM | POA: Diagnosis not present

## 2023-08-13 DIAGNOSIS — H43822 Vitreomacular adhesion, left eye: Secondary | ICD-10-CM | POA: Diagnosis not present

## 2023-08-13 DIAGNOSIS — H43811 Vitreous degeneration, right eye: Secondary | ICD-10-CM | POA: Diagnosis not present

## 2023-08-13 DIAGNOSIS — H2513 Age-related nuclear cataract, bilateral: Secondary | ICD-10-CM | POA: Diagnosis not present

## 2023-08-16 ENCOUNTER — Telehealth: Payer: Self-pay

## 2023-08-16 NOTE — Telephone Encounter (Signed)
**Note De-Identified Jaivon Vanbeek Obfuscation** Ordering provider: Janit Pouch, PA-c Associated diagnoses: Palpitations-R00.2 and Snoring-R06.83   WatchPAT PA obtained on 08/16/2023 by Altha Sweitzer, Avelina HERO, LPN. Authorization: Per the [employer] provider portal this PA has been approved: Order ID: [id number] Authorized Approval Valid Through:08/16/2023 - 10/14/2023  Patient notified of PIN (1234) on 08/16/2023 Nickie Warwick Notification Method: phone.  Phone note routed to covering staff for follow-up.

## 2023-08-21 ENCOUNTER — Encounter (INDEPENDENT_AMBULATORY_CARE_PROVIDER_SITE_OTHER): Payer: Self-pay | Admitting: Cardiology

## 2023-08-21 DIAGNOSIS — G4733 Obstructive sleep apnea (adult) (pediatric): Secondary | ICD-10-CM | POA: Diagnosis not present

## 2023-08-23 ENCOUNTER — Ambulatory Visit: Attending: Cardiology

## 2023-08-23 DIAGNOSIS — R0683 Snoring: Secondary | ICD-10-CM

## 2023-08-23 DIAGNOSIS — R000 Tachycardia, unspecified: Secondary | ICD-10-CM

## 2023-08-23 DIAGNOSIS — R002 Palpitations: Secondary | ICD-10-CM

## 2023-08-26 NOTE — Telephone Encounter (Signed)
 Per mychart message from yesterday, pt did complete her ITAMAR sleep study and results are still in processing per Dr. Dorine RN.   Copied below is mychart message referencing this:    Thank you!  I knew results may be awhile coming, I was just wanting to verify I had successfully completed on my end .   08/25/23 12:26 PM Judith Geni CROME, RN routed this conversation to [facility] Judith Geni CROME, RN to [name]     08/25/23 12:26 PM Hi [name], It looks like the results are still processing, I will see if the sleep studies team can advise further.  Geni, RN  Last read by Judith Salazar at 2:31PM on 08/25/2023. Judith Salazar to P [facility] Triage (supporting Artist Pouch, PA-C)     08/25/23 12:21 PM I performed the sleep study Friday night and completed at 7 am on Saturday 08/21/23.  It said was successfully entered.  I am just checking that it was received.  I understand the study may not have been read, but wanting to verify I was successful on my part.   Thank you,  Judith Salazar

## 2023-08-27 NOTE — Procedures (Signed)
   SLEEP STUDY REPORT Patient Information Study Date: 08/21/2023 Patient Name: Judith Salazar Patient ID: [id number] Birth Date: February 06, 1955 Age: 68 Gender: Female BMI: 43.4 (W=244 lb, H=5' 3'') Stopbang: 5 Referring Physician: Artist Pouch, PA  TEST DESCRIPTION: Home sleep apnea testing was completed using the WatchPat, a Type 1 device, utilizing  peripheral arterial tonometry (PAT), chest movement, actigraphy, pulse oximetry, pulse rate, body position and snore.  AHI was calculated with apnea and hypopnea using valid sleep time as the denominator. RDI includes apneas,  hypopneas, and RERAs. The data acquired and the scoring of sleep and all associated events were performed in  accordance with the recommended standards and specifications as outlined in the AASM Manual for the Scoring of  Sleep and Associated Events 2.2.0 (2015).  FINDINGS:  1. Severe Obstructive Sleep Apnea with AHI 33.2/hr.   2. No Central Sleep Apnea with pAHIc 1.8/hr.  3. Oxygen desaturations as low as 69%.  4. Moderate to severe snoring was present. O2 sats were < 88% for 40 min.  5. Total sleep time was 5 hrs and 43 min.  6. 31.4 % of total sleep time was spent in REM sleep.   7. Shortened sleep onset latency at 6 min.   8. Normal REM sleep onset latency at 92 min.   9. Total awakenings were 4 .  10. Arrhythmia detection: None  DIAGNOSIS:  Severe Obstructive Sleep Apnea (G47.33) Nocturnal Hypoxemia  RECOMMENDATIONS: 1. Clinical correlation of these findings is necessary. The decision to treat obstructive sleep apnea (OSA) is usually  based on the presence of apnea symptoms or the presence of associated medical conditions such as Hypertension,  Congestive Heart Failure, Atrial Fibrillation or Obesity. The most common symptoms of OSA are snoring, gasping for  breath while sleeping, daytime sleepiness and fatigue.  2. Initiating apnea therapy is recommended given the presence of symptoms and/or associated  conditions.  Recommend proceeding with one of the following:  a. Auto-CPAP therapy with a pressure range of 5-20cm H2O.  b. An oral appliance (OA) that can be obtained from certain dentists with expertise in sleep medicine. These are  primarily of use in non-obese patients with mild and moderate disease.  c. An ENT consultation which may be useful to look for specific causes of obstruction and possible treatment  options.  d. If patient is intolerant to PAP therapy, consider referral to ENT for evaluation for hypoglossal nerve stimulator.  3. Close follow-up is necessary to ensure success with CPAP or oral appliance therapy for maximum benefit . 4. A follow-up oximetry study on CPAP is recommended to assess the adequacy of therapy and determine the need  for supplemental oxygen or the potential need for Bi-level therapy. An arterial blood gas to determine the adequacy of  baseline ventilation and oxygenation should also be considered. 5. Healthy sleep recommendations include: adequate nightly sleep (normal 7-9 hrs/night), avoidance of caffeine after  noon and alcohol near bedtime, and maintaining a sleep environment that is cool, dark and quiet. 6. Weight loss for overweight patients is recommended. Even modest amounts of weight loss can significantly  improve the severity of sleep apnea. 7. Snoring recommendations include: weight loss where appropriate, side sleeping, and avoidance of alcohol before  bed. 8. Operation of motor vehicle should be avoided when sleepy.  Signature: Wilbert Bihari, MD; [facility]; Diplomat, American Board of Sleep  Medicine Electronically Signed: 08/27/2023 3:33:01 P

## 2023-08-30 ENCOUNTER — Ambulatory Visit: Payer: Self-pay | Admitting: Cardiology

## 2023-08-30 ENCOUNTER — Ambulatory Visit (HOSPITAL_COMMUNITY)
Admission: RE | Admit: 2023-08-30 | Discharge: 2023-08-30 | Disposition: A | Discharge status: Home / Self Care | Source: Ambulatory Visit | Attending: Cardiology | Admitting: Cardiology

## 2023-08-30 DIAGNOSIS — R011 Cardiac murmur, unspecified: Secondary | ICD-10-CM | POA: Diagnosis not present

## 2023-08-30 DIAGNOSIS — I359 Nonrheumatic aortic valve disorder, unspecified: Secondary | ICD-10-CM

## 2023-08-30 DIAGNOSIS — R002 Palpitations: Secondary | ICD-10-CM | POA: Diagnosis not present

## 2023-08-30 LAB — ECHOCARDIOGRAM COMPLETE
AR max vel: 1.36 cm2
AV Area VTI: 1.35 cm2
AV Area mean vel: 1.28 cm2
AV Mean grad: 10.8 mmHg
AV Peak grad: 19.8 mmHg
Ao pk vel: 2.22 m/s
Area-P 1/2: 2.24 cm2
MV M vel: 3.99 m/s
MV Peak grad: 63.7 mmHg
S' Lateral: 3 cm

## 2023-08-31 ENCOUNTER — Telehealth: Payer: Self-pay

## 2023-08-31 DIAGNOSIS — R0683 Snoring: Secondary | ICD-10-CM

## 2023-08-31 DIAGNOSIS — R011 Cardiac murmur, unspecified: Secondary | ICD-10-CM

## 2023-08-31 DIAGNOSIS — I350 Nonrheumatic aortic (valve) stenosis: Secondary | ICD-10-CM

## 2023-08-31 DIAGNOSIS — R000 Tachycardia, unspecified: Secondary | ICD-10-CM

## 2023-08-31 DIAGNOSIS — G4733 Obstructive sleep apnea (adult) (pediatric): Secondary | ICD-10-CM

## 2023-08-31 DIAGNOSIS — R002 Palpitations: Secondary | ICD-10-CM

## 2023-08-31 DIAGNOSIS — I455 Other specified heart block: Secondary | ICD-10-CM

## 2023-08-31 NOTE — Telephone Encounter (Signed)
-----   Message from Wilbert Bihari sent at 08/27/2023  3:34 PM EDT ----- Please let patient know that they have sleep apnea.  Recommend therapeutic CPAP titration for treatment of patient's sleep disordered breathing.

## 2023-08-31 NOTE — Telephone Encounter (Signed)
 Notified patient of sleep study results and recommendations. All questions were answered and patient verbalized understanding. CPAP Titration ordered today.

## 2023-09-01 ENCOUNTER — Telehealth: Payer: Self-pay

## 2023-09-01 NOTE — Telephone Encounter (Signed)
 Received referral from Dr Garnette Ore at [facility] [phone]. Referral is for severe OSA - already had sleep study in [facility]. Requesting Dr Dohmeier only per referring MD. Placed in sleep mailbox

## 2023-09-02 NOTE — Telephone Encounter (Signed)
 Pt has been scheduled on the held spot for Monday with provider for sleep consult.

## 2023-09-02 NOTE — Telephone Encounter (Signed)
 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 She is wanting the patient to establish here for sleep care. She had a HST through Dr Dorine office but felt it was inaccurate and didn't feel that she slept as much as the study stated due to her husband being up and stated she was not sleeping at the time that it stated she was. She would like to pursue managing her sleep care here and is willing to repeat a in lab split night to evaluate apnea and whether treatment is needed. Discussed with Dr Chalice and she was agreeable to this consult.

## 2023-09-03 NOTE — Telephone Encounter (Signed)
 Addressed in telephone encounter from 08/31/23.

## 2023-09-06 ENCOUNTER — Encounter: Payer: Self-pay | Admitting: Neurology

## 2023-09-06 ENCOUNTER — Ambulatory Visit (INDEPENDENT_AMBULATORY_CARE_PROVIDER_SITE_OTHER): Admitting: Neurology

## 2023-09-06 VITALS — BP 138/92 | HR 56 | Ht 63.0 in | Wt 249.0 lb

## 2023-09-06 DIAGNOSIS — I350 Nonrheumatic aortic (valve) stenosis: Secondary | ICD-10-CM

## 2023-09-06 DIAGNOSIS — R002 Palpitations: Secondary | ICD-10-CM

## 2023-09-06 DIAGNOSIS — R000 Tachycardia, unspecified: Secondary | ICD-10-CM

## 2023-09-06 DIAGNOSIS — E66813 Obesity, class 3: Secondary | ICD-10-CM

## 2023-09-06 DIAGNOSIS — Z6841 Body Mass Index (BMI) 40.0 and over, adult: Secondary | ICD-10-CM

## 2023-09-06 DIAGNOSIS — E661 Drug-induced obesity: Secondary | ICD-10-CM

## 2023-09-06 NOTE — Progress Notes (Addendum)
 SLEEP MEDICINE CLINIC    Provider:  Dedra Gores, MD  Primary Care Physician:  Nichole Senior, MD [address] [place] [address]     Referring Provider: Nichole Senior, Md [address] 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 .  Chief concern according to patient :  She was originally going to be sent here for sleep eval for a suspect  new onset A fib. She had spells of palpitations in February and May 2025, ED visit in [facility]- and there she had a 14 sec run of v tech.  A prolonged  cardiac monitor showed PVC/ PAC s.   PCP referred for sleep consult and to Cardiology, Dr Inocencio went forward with ordering HST.  Wanted to have a loop recorder.  The HST indicated severe OSA to be present . Dr Nichole wanted her following up.  The husband of 45 years was awake while she was completing the test.  He never heard her snoring and he didn't witness any apnea. She was concerned with the accuracy of the HST. Would like to pursue an in- lab PSG. She is for now on Eloquis,  and is to stay on it until she has a proper cardiac MRI  and full PSG .      Sleep relevant medical history: atrial flutter, sinus tach,  perhaps atrial fib,  no chest pain, no edema,  vibration in my chest , Nocturia is rare, N Tonsillectomy and adenoids at age 53, and no respiratory  allergies , cervical spine fusion - biological, thought to be related to breech delivery   neck ROM restricted.   Family medical /sleep history: gather passed with MI , age 30, no known  family member on CPAP with OSA, mother had dementia , passed at age 3, 78.  Social history:  Patient is working as  a NP ,  and lives in a household with husband Clinical cytogeneticist)  , has a daughter age 47.  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  Pets are  present; none   Tobacco use: none, ETOH use ; one a month,  Caffeine intake in form of Coffee( 1/2 cup in AM ) Soda( /) Tea ( /) no energy drinks- since May 2025 Exercise in form of  walking, limited by osteoarthritis .      Sleep habits are as follows: The patient's dinner time is between 6.30-7 PM. The patient goes to bed at 11 PM and continues to sleep for 5.5-7.5  hours, wakes rarely  for  bathroom breaks.  The preferred sleep position is laterally, with the support of 2 pillows. One of them to position the neck 15 degrees, non supine - Dreams are reportedly frequent.  The patient wakes up when her husband  gets up- not with an alarm. 6.30  AM is the usual rise time.  She reports not feeling refreshed or restored in AM, with symptoms such as dry mouth, no morning headaches but stiffness.  Naps are taken seldomly infrequently, lasting from 30- 40 minutes and are interfering with  nocturnal sleep.    Review of Systems: Out of a complete 14 system review, the patient complains of only the following symptoms, and all other reviewed systems are negative.:    How likely are you to doze in the following situations: 0 = not likely, 1 = slight chance, 2 = moderate chance, 3 = high chance   Sitting and Reading? 0 Watching Television? 1-2 Sitting inactive in a public place (theater or meeting)? As a passenger in a car for an hour without a break? Lying down in the afternoon when circumstances permit? Sitting and talking to someone? Sitting quietly after lunch without alcohol? In a car, while stopped for a few minutes in traffic?   Total = 1-2/ 24 points    FSS endorsed at 17/ 63 points.   GDS 1/ 15    Social History   Socioeconomic History   Marital status: Married    Spouse name: Not on file   Number of  children: 1   Years of education: Not on file   Highest education level: Not on file  Occupational History   Occupation: NP-CS, [employer]  Tobacco Use   Smoking status: Never   Smokeless tobacco: Never  Vaping Use   Vaping status: Never Used  Substance and Sexual Activity   Alcohol use: Yes    Comment: one monthly   Drug use: No   Sexual activity: Yes    Partners: Male    Birth control/protection: Surgical    Comment: TAH  Other Topics Concern   Not on file  Social History Narrative   Not on file   Social Drivers of Health   Financial Resource Strain: Not on file  Food Insecurity: Not on file  Transportation Needs: Not on file  Physical Activity: Not on file  Stress: Not on file  Social Connections: Not on file    Family History  Problem Relation Age of Onset   Diabetes Maternal Uncle    Lung cancer Maternal Uncle        mets-renal cell carcinoma   Skin cancer Mother    Thyroid  disease Mother        graves disease-now hypothyroid-treatment with radiation iodine   Dementia Mother        mild   Osteoporosis Mother    Pancreatic cancer Maternal Grandmother    Prostate cancer Paternal Grandfather    Hypertension Father    Heart attack Father        deceased age 3   Congestive Heart Failure Maternal Grandfather    COPD Maternal Grandfather    Congestive Heart Failure Maternal Uncle        CABG   Cancer Daughter     Past Medical History:  Diagnosis Date   Anemia    Cervical fusion syndrome    DDD (degenerative disc disease), cervical    Endometrial hyperplasia with atypia    GI bleed 01/21/2016   History of Clostridium difficile 1995   hospitalized x 3 days   Hx of migraines    Madelung's deformity    right wrist   Shingles 3/10, 11/16   Vitamin D  deficiency     Past Surgical History:  Procedure Laterality Date   ABDOMINAL HYSTERECTOMY  2006   supra cervix   CESAREAN SECTION     DILATION AND CURETTAGE OF UTERUS     TONSILLECTOMY      WISDOM TOOTH EXTRACTION  Current Outpatient Medications on File Prior to Visit  Medication Sig Dispense Refill   acetaminophen (TYLENOL) 500 MG tablet Take 500 mg by mouth 2 (two) times daily. Back pain     ELIQUIS 5 MG TABS tablet Take 5 mg by mouth 2 (two) times daily.     esomeprazole (NEXIUM) 20 MG capsule Take 20 mg by mouth daily.     estradiol  (ESTRACE ) 0.1 MG/GM vaginal cream Place 1 Applicatorful vaginally 3 (three) times a week. 42.5 g 1   losartan-hydrochlorothiazide (HYZAAR) 50-12.5 MG tablet Take 1 tablet by mouth daily.     Magnesium 250 MG TABS Take 1 tablet by mouth daily.     methocarbamol (ROBAXIN) 500 MG tablet Take 500 mg by mouth 4 (four) times daily.     metoprolol succinate (TOPROL-XL) 50 MG 24 hr tablet Take 50 mg by mouth daily.     promethazine (PHENERGAN) 25 MG tablet Take 25 mg by mouth every 4 (four) hours as needed for nausea.     rosuvastatin (CRESTOR) 10 MG tablet Take 10 mg by mouth daily.     sodium fluoride (FLUORISHIELD) 1.1 % GEL dental gel Place 1 Application onto teeth at bedtime.     vitamin B-12 (CYANOCOBALAMIN ) 1000 MCG tablet Take 1,000 mcg by mouth daily.     Vitamin D , Ergocalciferol , (DRISDOL ) 1.25 MG (50000 UNIT) CAPS capsule TAKE 1 CAPSULE ONCE A WEEK. 12 capsule 4   No current facility-administered medications on file prior to visit.    No Known Allergies   DIAGNOSTIC DATA (LABS, IMAGING, TESTING) - I reviewed patient records, labs, notes, testing and imaging myself where available.  No results found for: WBC, HGB, HCT, MCV, PLT No results found for: NA, K, CL, CO2, GLUCOSE, BUN, CREATININE, CALCIUM, PROT, ALBUMIN, AST, ALT, ALKPHOS, BILITOT, GFRNONAA, GFRAA No results found for: CHOL, HDL, LDLCALC, LDLDIRECT, TRIG, CHOLHDL No results found for: YHAJ8R No results found for: VITAMINB12 No results found for: TSH  PHYSICAL EXAM:  Today's Vitals   09/06/23 1326  Weight:  249 lb (112.9 kg)  Height: 5' 3 (1.6 m)   Body mass index is 44.11 kg/m.   Wt Readings from Last 3 Encounters:  09/06/23 249 lb (112.9 kg)  08/12/23 245 lb 6.4 oz (111.3 kg)  12/05/20 263 lb (119.3 kg)     Ht Readings from Last 3 Encounters:  09/06/23 5' 3 (1.6 m)  08/12/23 5' 3 (1.6 m)  12/05/20 5' 3 (1.6 m)      General: The patient is awake, alert and appears not in acute distress. The patient is well groomed. Head: Normocephalic, atraumatic. Neck is supple.  Aortic regurgitation murmur.   She has restrictions in neck rotation.   Mallampati 2,  neck circumference:15 inches . Nasal airflow is patent.   Retrognathia is mild . Overbite, small mouth.  Dental status: biological  Cardiovascular:  Regular rate and cardiac rhythm by pulse,  without distended neck veins. Respiratory: Lungs are clear to auscultation.  Skin:  Without evidence of ankle edema, or rash. Trunk: The patient's posture is erect.   NEUROLOGIC EXAM: The patient is awake and alert, oriented to place and time.   Memory subjective described as intact.  Attention span & concentration ability appears normal.  Speech is fluent,  without  dysarthria, dysphonia or aphasia.  Mood and affect are appropriate.   Cranial nerves: no loss of smell or taste reported  Pupils are equal and briskly reactive to light. Funduscopic exam deferred.  Extraocular movements in  vertical and horizontal planes were intact and without nystagmus. No Diplopia. Visual fields by finger perimetry are intact. Hearing was intact to soft voice and finger rubbing.    Facial sensation intact to fine touch.  Facial motor strength is symmetric and tongue and uvula move midline.  Neck ROM :tilt and flexion extension were normal for age and shoulder shrug was symmetrical.    Motor exam:  Symmetric bulk, tone and ROM.   Normal tone without cog- wheeling, symmetric grip strength .   Coordination: no  evidence of ataxia, dysmetria or tremor.    Gait and station: Patient could rise unassisted from a seated position, walked without assistive device.  Deep tendon reflexes: in the  upper and lower extremities are symmetric and intact.      ASSESSMENT AND PLAN 68 y.o. year old NP with recent palpitations here with:    1) a possible manifestation of atrial fibs or atrial flutter which was captured by self on video while she was in the emergency room at [facility] in [place].  She has known PACs and PVCs but she has not had a cardiac monitor having proven that she has atrial fibrillation.  Some of her symptoms could correlate with that diagnosis others do not.  In the workup at the A-fib clinic was in Home sleep Test which was interpreted by Dr. Jettie and revealed an obstructive sleep apnea with an AHI of 33.2, and oxygen nadir at 69% moderate to severe snoring and a time total time in hypoxia for 14 minutes.  Remarkably of these scores were followed by the AASM manual and not by CMS.  The total sleep time was only under 6 hours.  All of the findings were somewhat divergent from the experience that the patient and her husband had that night and she would like an in-lab sleep study to have a confirmatory diagnosis or perhaps a diagnosis dispute.  I listed her current medication.  She is taking magnesium once a day. This started after [facility] labs revealed a low mag level.    Methocarbamol listed,  this is potentially contributing to apnea.   2) Eloquis does not allow her to use NSAIDS.   She needed a PPI.   I will order ASAP an in lab sleep study , and see if the test correlates with the HST.   I plan to follow up either personally or through our NP within 2 months.  We will stay in touch on MyChart or by phone.   I would like to thank Nichole Senior, Md [address] [place],  [place] [address] for allowing me to meet with and to take care of this pleasant patient.   Discussion of sleep hygiene setting bedtime and rise time,  hot  shower  before bed time, no screen light in the bedroom, the bedroom should be cool, quiet and dark. Night lights should illuminate the floor not shine into your eyes. Golden glow  light is less intrusive than blue or cold light.  Read in a book with pages, not on a device. Consider audio books and soothing  sound -scapes.   CC: I will share my notes with Camnitz .  After spending a total time of  45  minutes face to face and additional time for physical and neurologic examination, review of laboratory studies,  personal review of imaging studies, reports and results of other testing and review of referral information / records as far as provided in visit,   Electronically signed  by: Dedra Gores, MD 09/06/2023 1:32 PM  [facility] Board certified by The [employer] of Sleep Medicine and Diplomate of the [employer] of Sleep Medicine. Board certified In Neurology through the ABPN, Fellow of the [employer] of Neurology.

## 2023-09-06 NOTE — Patient Instructions (Signed)
 [employer] Sleep Information, Adult Quality sleep is important for your mental and physical health. It also improves your quality of life. Quality sleep means you: Are asleep for most of the time you are in bed. Fall asleep within 30 minutes. Wake up no more than once a night. Are awake for no longer than 20 minutes if you do wake up during the night. Most adults need 7-8 hours of quality sleep each night. How can poor sleep affect me? If you do not get enough quality sleep, you may have: Mood swings. Daytime sleepiness. Decreased alertness, reaction time, and concentration. Sleep disorders, such as insomnia and sleep apnea. Difficulty with: Solving problems. Coping with stress. Paying attention. These issues may affect your performance and productivity at work, school, and home. Lack of sleep may also put you at higher risk for accidents, suicide, and risky behaviors. If you do not get quality sleep, you may also be at higher risk for several health problems, including: Infections. Type 2 diabetes. Heart disease. High blood pressure. Obesity. Worsening of long-term conditions, like arthritis, kidney disease, depression, Parkinson's disease, and epilepsy. What actions can I take to get more quality sleep? Sleep schedule and routine Stick to a sleep schedule. Go to sleep and wake up at about the same time each day. Do not try to sleep less on weekdays and make up for lost sleep on weekends. This does not work. Limit naps during the day to 30 minutes or less. Do not take naps in the late afternoon. Make time to relax before bed. Reading, listening to music, or taking a hot bath promotes quality sleep. Make your bedroom a place that promotes quality sleep. Keep your bedroom dark, quiet, and at a comfortable room temperature. Make sure your bed is comfortable. Avoid using electronic devices that give off bright blue light for 30 minutes before bedtime. Your brain perceives bright blue  light as sunlight. This includes television, phones, and computers. If you are lying awake in bed for longer than 20 minutes, get up and do a relaxing activity until you feel sleepy. Lifestyle     Try to get at least 30 minutes of exercise on most days. Do not exercise 2-3 hours before going to bed. Do not use any products that contain nicotine or tobacco. These products include cigarettes, chewing tobacco, and vaping devices, such as e-cigarettes. If you need help quitting, ask your health care provider. Do not drink caffeinated beverages for at least 8 hours before going to bed. Coffee, tea, and some sodas contain caffeine. Do not drink alcohol or eat large meals close to bedtime. Try to get at least 30 minutes of sunlight every day. Morning sunlight is best. Medical concerns Work with your health care provider to treat medical conditions that may affect sleeping, such as: Nasal obstruction. Snoring. Sleep apnea and other sleep disorders. Talk to your health care provider if you think any of your prescription medicines may cause you to have difficulty falling or staying asleep. If you have sleep problems, talk with a sleep consultant. If you think you have a sleep disorder, talk with your health care provider about getting evaluated by a specialist. Where to find more information Sleep Foundation: sleepfoundation.org American Academy of Sleep Medicine: aasm.org Centers for Disease Control and Prevention (CDC): TonerPromos.no Contact a health care provider if: You have trouble getting to sleep or staying asleep. You often wake up very early in the morning and cannot get back to sleep. You have daytime  sleepiness. You have daytime sleep attacks of suddenly falling asleep and sudden muscle weakness (narcolepsy). You have a tingling sensation in your legs with a strong urge to move your legs (restless legs syndrome). You stop breathing briefly during sleep (sleep apnea). You think you have a  sleep disorder or are taking a medicine that is affecting your quality of sleep. Summary Most adults need 7-8 hours of quality sleep each night. Getting enough quality sleep is important for your mental and physical health. Make your bedroom a place that promotes quality sleep, and avoid things that may cause you to have poor sleep, such as alcohol, caffeine, smoking, or large meals. Talk to your health care provider if you have trouble falling asleep or staying asleep. This information is not intended to replace advice given to you by your health care provider. Make sure you discuss any questions you have with your health care provider. Document Revised: 05/14/2021 Document Reviewed: 05/14/2021 Elsevier Patient Education  2024 [employer]. will order ASAP an in lab sleep study , and see if the test correlates with the HST.     I plan to follow up either personally or through our NP within 2 months.  We will stay in touch on MyChart or by phone.

## 2023-09-07 ENCOUNTER — Ambulatory Visit: Admitting: Neurology

## 2023-09-07 ENCOUNTER — Telehealth: Payer: Self-pay | Admitting: Neurology

## 2023-09-07 DIAGNOSIS — E661 Drug-induced obesity: Secondary | ICD-10-CM

## 2023-09-07 DIAGNOSIS — E66813 Obesity, class 3: Secondary | ICD-10-CM

## 2023-09-07 DIAGNOSIS — I350 Nonrheumatic aortic (valve) stenosis: Secondary | ICD-10-CM

## 2023-09-07 DIAGNOSIS — R002 Palpitations: Secondary | ICD-10-CM

## 2023-09-07 DIAGNOSIS — G4733 Obstructive sleep apnea (adult) (pediatric): Secondary | ICD-10-CM | POA: Diagnosis not present

## 2023-09-07 DIAGNOSIS — R000 Tachycardia, unspecified: Secondary | ICD-10-CM

## 2023-09-07 DIAGNOSIS — R29818 Other symptoms and signs involving the nervous system: Secondary | ICD-10-CM

## 2023-09-07 NOTE — Telephone Encounter (Signed)
 [employer]PS[employer] shara: [id number] (exp. 09/07/23 to 11/05/23)   Patient is scheduled at [facility] for 09/07/2023 at 9 pm.  Mychart message and emailed packet.

## 2023-09-08 ENCOUNTER — Ambulatory Visit: Payer: Self-pay | Admitting: Neurology

## 2023-09-08 DIAGNOSIS — G4733 Obstructive sleep apnea (adult) (pediatric): Secondary | ICD-10-CM | POA: Insufficient documentation

## 2023-09-08 NOTE — Procedures (Signed)
 [facility] POLYSOMNOGRAPHY  INTERPRETATION REPORT   STUDY DATE:  09/07/2023     PATIENT NAME:  Judith Salazar , Judith Salazar         DATE OF BIRTH:  10/13/1955  PATIENT ID:  [id number]    TYPE OF STUDY:  PSG  READING PHYSICIAN: DEDRA GORES, MD REFERRED BY:  Elspeth Ore, MD  SCORING TECHNICIAN: Donnice Counts, RPSGT   HISTORY:  Judith Salazar Judith BRAVO.  Salazar was seen on 09-06-2023 for an evaluation of possible sleep apnea, the patient had 2 possible atrial fibrillation events and underwent a HST through the cardiology a fib clinic, indication the presence of severe sleep apnea, a finding she doubts. Would like confirmatory PSG / SPLIT study.  Split ordered at AHI 20/h which the patient did not reach at 2 hour mark.   ADDITIONAL INFORMATION:  The Epworth Sleepiness Scale endorsed at 1 /24 points (scores above or equal to 10 are suggestive of hypersomnolence). FSS endorsed at  17 /63 points. GDS 1/ 15 points. Risk factors reviewed.   Height: 63 in Weight: 249 lbs (BMI 44.1 ) Neck Size: 15 in , small oral opening, Mallampati 2.  MEDICATIONS: Tylenol, Eliquis, Nexium, Hyzaar, Magnesium, Robaxin, Metoprolol-XL, Phenergan, Crestor, Flourishield, Drisdol , Cyanocobalamin    TECHNICAL DESCRIPTION: A registered sleep technologist ( RPSGT)  was in attendance for the duration of the recording.  Data collection, scoring, video monitoring, and reporting were performed in compliance with the AASM Manual for the Scoring of Sleep and Associated Events; (Hypopnea is scored based on the criteria listed in Section VIII D. 1b in the AASM Manual V2.6 using a 3% oxygen desaturation rule.  SLEEP CONTINUITY AND SLEEP ARCHITECTURE:  Lights-out was at 22:10: and lights-on at  05:02:, with  6.9 hours of recording time .  Total sleep time ( TST) was 293.0 minutes with a decreased sleep efficiency at 71.1%.  AT THE 2 HOUR MARK, THERE WAS AN AHI OF ONLY 13.9/h. The study was therefore not split into a  treatment part on CPAP.   Sleep latency was borderline prolonged at 32.5 minutes.  REM sleep latency was prolonged at 319.0 minutes.  Of the total sleep time, the percentage of stage N1 sleep was 7.8%, stage N2 sleep was 84%, stage N3 sleep was 2.4%, and REM sleep was 5.6%.  There were 1 Stage R periods observed on this study night, 32 awakenings (i.e. transitions to Stage W from any sleep stage), and 91 total stage transitions.  Wake after sleep onset (WASO) time accounted for 86 minutes.  BODY POSITION:  TST was divided between the following sleep positions: 29.7% supine; 70.3% lateral; 0% prone. Total supine REM sleep time was 00 minutes (0% of total REM sleep). There was only 5.6% REM sleep.   RESPIRATORY MONITORING:  Based on AASM criteria (using a 3% oxygen desaturation and /or arousal rule for scoring hypopneas), there were 43 apneas (42 obstructive; 1 central; 0 mixed), and 139 hypopneas. Apnea index was 8.8. Hypopnea index was 28.5.  The total AHI (apnea-hypopnea index) was 37.3/h  overall ( most dominant apnea hypopnea index  at 62.8/h was seen while  in supine, versus 26/h in non-supine sleep,  65.5/h REM, 36/h in non- REM AHI.   There were 0 respiratory effort-related arousals (RERAs).  The RERA index was 0 events/h. Total respiratory disturbance index (RDI) was 37.3 events/h. RDI results showed: supine RDI  62.8 /h; non-supine RDI 26.5 /h; REM RDI 65.5 /h, supine REM RDI 0.0 /  h.  OXIMETRY: Oxyhemoglobin Saturation Nadir during sleep was at  66% from a mean of 90%.   Hypoxemia (<89%) was present for 70.2 minutes, or 24.0% of total sleep time.  the most severe hypoxia was seen during REM sleep.  LIMB MOVEMENTS: There were 0 periodic limb movements of sleep (0.0/h). AROUSAL: The  arousal index of 22.5  arousals/hour.  Of these, 63 were identified as respiratory-related arousals (13 /h), 0 were PLM-related arousals (0 /h), and 47 were non-specific arousals (10 /h).  EEG:  PSG EEG was of  normal amplitude and frequency, with symmetric manifestation of sleep stages. EKG: The electrocardiogram documented NSR without any atrial fibrillation.  The average heart rate during sleep was 60 bpm.  The heart rate during sleep varied between a minimum of N/A and  a maximum of  78 bpm. AUDIO and VIDEO: Snoring was classified as mild - non continuous, of low volume.  IMPRESSION: Sleep disordered breathing was present, The AHI of 37/h  places the patient into the severe sleep apnea category, all obstructive events, mild snoring . The apnea frequency was clearly dependent on sleep position, with the most frequent occurrence in supine sleep.  Sleep hypoxemia was noted-  and nadir of oxygen was reached during non-supine REM sleep.   Total sleep time was reduced at 293.0 minutes.  Sleep efficiency was decreased at 71.1%.    Sleep fragmentation was noted- The majority of sleep arousals was related to respiratory events.    RECOMMENDATIONS: I agree with positive airway therapy - and would urge the patient to avoid supine sleep.  Only PAP therapy can correct the AHI and the low oxygen levels. Weight loss will help to reduce REM dependent hypoventilation. Patient wit a BMI of 30 or less are much less likely to have REM dependent sleep apnea/ hypoxia.   Treatment with an autotitration capable RESMED CPAP device is recommended, I suggest settings from 7-15 cm water , Pressure-Ramp beginning at 5 cm water, with  1 cm EPR and heated humidification . The patient is not a snorer by my observation and could be served by a nasal mask, nasal cradle or nasal pillow.   I like to meet with Nurse Practitioner Gatz again in 2-4 months, by that time the CPAP machine should be brought to the appointment.    DEDRA GORES, MD [facility] Sleep Board certified by The [employer] of Sleep Medicine and Diplomate of the [employer] of Sleep Medicine. Board certified In Neurology  through the ABPN, Fellow of the [employer] of Neurology.                    [facility] PSG Summary    General Information  Name: Judith Salazar, Judith Salazar BMI: 44.11 Physician: DEDRA GORES, MD  ID: [id number] Height: 63.0 in Technician: Donnice Counts, RPSGT  Sex: Female Weight: 249.0 lb Record: [id number]  Age: 68 [10/07/1955] Date: 09/07/2023    Medical & Medication History    Judith Salazar , Judith Salazar , is a 68 y.o. female Judith Salazar and is seen upon referral on 09/06/2023 from Dr Nichole, MD, for a Sleep consultation. . Chief concern according to patient : She was originally going to be sent here for sleep eval for a suspect new onset A fib. She had spells of palpitations in February and May 2025, ED visit in [facility] ED- and there she had a 14 sec run of v tech. A prolonged cardiac  monitor showed PVC/ PAC s. PCP referred for sleep consult and to Cardiology, Dr Inocencio went forward with ordering HST. Wanted to have a loop recorder. The HST indicated severe OSA to be present . Dr Nichole wanted her following up. The husband of 45 years was awake while she was completing the test. He never heard her snoring and he didn't witness any apnea. She was concerned with the accuracy of the HST. Would like to pursue an in- lab PSG. She is for now on Eloquis, and is to stay on it until she has a proper cardiac MRI and full PSG .  Tylenol, Eliquis, Nexium, Hyzaar, Magnesium, Robaxin, Toprol-XL, Phenergan, Crestor, Flourishield, Drisdol , Cyanocobalamin    Sleep Disorder      Comments   Patient arrived for a diagnostic polysomnogram. Procedure explained and all questions answered. Standard paste setup without complications. Patient slept supine, left, and right. No significant snoring was noted. Respiratory events observed, primarily while supine. After two hours total sleep time, AHI = 14. No obvious cardiac arrhythmias observed. Some PLMS observed. No restroom  visit.    Lights out: 10:10:02 PM Lights on: 05:02:19 AM   Time Total Supine Side Prone Upright  Recording (TRT) 6h 52.96m 2h 21.83m 4h 31.41m 0h 0.26m 0h 0.7m  Sleep (TST) 4h 53.28m 1h 27.66m 3h 26.16m 0h 0.17m 0h 0.37m   Latency N1 N2 N3 REM Onset Per. Slp. Eff.  Actual 0h 0.9m 0h 0.14m 0h 26.9m 5h 19.74m 0h 32.39m 0h 41.62m 71.12%   Stg Dur Wake N1 N2 N3 REM  Total 119.0 23.0 246.5 7.0 16.5  Supine 54.0 7.5 79.0 0.5 0.0  Side 65.0 15.5 167.5 6.5 16.5  Prone 0.0 0.0 0.0 0.0 0.0  Upright 0.0 0.0 0.0 0.0 0.0   Stg % Wake N1 N2 N3 REM  Total 28.9 7.8 84.1 2.4 5.6  Supine 13.1 2.6 27.0 0.2 0.0  Side 15.8 5.3 57.2 2.2 5.6  Prone 0.0 0.0 0.0 0.0 0.0  Upright 0.0 0.0 0.0 0.0 0.0     Apnea Summary Sub Supine Side Prone Upright  Total 43 Total 43 23 20 0 0    REM 15 0 15 0 0    NREM 28 23 5  0 0  Obs 42 REM 15 0 15 0 0    NREM 27 23 4  0 0  Mix 0 REM 0 0 0 0 0    NREM 0 0 0 0 0  Cen 1 REM 0 0 0 0 0    NREM 1 0 1 0 0   Rera Summary Sub Supine Side Prone Upright  Total 0 Total 0 0 0 0 0    REM 0 0 0 0 0    NREM 0 0 0 0 0   Hypopnea Summary Sub Supine Side Prone Upright  Total 139 Total 139 68 71 0 0    REM 3 0 3 0 0    NREM 136 68 68 0 0   4% Hypopnea Summary Sub Supine Side Prone Upright  Total (4%) 96 Total 96 60 36 0 0    REM 2 0 2 0 0    NREM 94 60 34 0 0     AHI Total Obs Mix Cen  37.27 Apnea 8.81 8.60 0.00 0.20   Hypopnea 28.46 -- -- --  28.46 Hypopnea (4%) 19.66 -- -- --    Total Supine Side Prone Upright  Position AHI 37.27 62.76 26.50 0.00 0.00  REM AHI 65.45   NREM  AHI 35.59   Position RDI 37.27 62.76 26.50 0.00 0.00  REM RDI 65.45   NREM RDI 35.59    4% Hypopnea Total Supine Side Prone Upright  Position AHI (4%) 28.46 57.24 16.31 0.00 0.00  REM AHI (4%) 61.82   NREM AHI (4%) 26.47   Position RDI (4%) 28.46 57.24 16.31 0.00 0.00  REM RDI (4%) 61.82   NREM RDI (4%) 26.47    Desaturation Information Threshold: 2% <100% <90% <80% <70% <60% <50% <40%  Supine  132.0 91.0 0.0 0.0 0.0 0.0 0.0  Side 164.0 82.0 18.0 5.0 0.0 0.0 0.0  Prone 0.0 0.0 0.0 0.0 0.0 0.0 0.0  Upright 0.0 0.0 0.0 0.0 0.0 0.0 0.0  Total 296.0 173.0 18.0 5.0 0.0 0.0 0.0  Index 46.8 27.4 2.8 0.8 0.0 0.0 0.0   Threshold: 3% <100% <90% <80% <70% <60% <50% <40%  Supine 109.0 85.0 0.0 0.0 0.0 0.0 0.0  Side 116.0 75.0 17.0 5.0 0.0 0.0 0.0  Prone 0.0 0.0 0.0 0.0 0.0 0.0 0.0  Upright 0.0 0.0 0.0 0.0 0.0 0.0 0.0  Total 225.0 160.0 17.0 5.0 0.0 0.0 0.0  Index 35.6 25.3 2.7 0.8 0.0 0.0 0.0   Threshold: 4% <100% <90% <80% <70% <60% <50% <40%  Supine 89.0 75.0 0.0 0.0 0.0 0.0 0.0  Side 61.0 49.0 14.0 4.0 0.0 0.0 0.0  Prone 0.0 0.0 0.0 0.0 0.0 0.0 0.0  Upright 0.0 0.0 0.0 0.0 0.0 0.0 0.0  Total 150.0 124.0 14.0 4.0 0.0 0.0 0.0  Index 23.7 19.6 2.2 0.6 0.0 0.0 0.0   Threshold: 3% <100% <90% <80% <70% <60% <50% <40%  Supine 109 85 0 0 0 0 0  Side 116 75 17 5 0 0 0  Prone 0 0 0 0 0 0 0  Upright 0 0 0 0 0 0 0  Total 225 160 17 5 0 0 0   Awakening/Arousal Information # of Awakenings 32  Wake after sleep onset 86.47m  Wake after persistent sleep 86.55m   Arousal Assoc. Arousals Index  Apneas 16 3.3  Hypopneas 47 9.6  Leg Movements 0 0.0  Snore 0 0.0  PTT Arousals 0 0.0  Spontaneous 47 9.6  Total 110 22.5  Leg Movement Information PLMS LMs Index  Total LMs during PLMS 0 0.0  LMs w/ Microarousals 0 0.0   LM LMs Index  w/ Microarousal 0 0.0  w/ Awakening 0 0.0  w/ Resp Event 0 0.0  Spontaneous 1 0.2  Total 1 0.2     Desaturation threshold setting: 3% Minimum desaturation setting: 10 seconds SaO2 nadir: 66% The longest event was a 37 sec obstructive Hypopnea with a minimum SaO2 of 85%. The lowest SaO2 was 66% associated with a 15 sec obstructive Apnea. EKG Rates  EKG Avg Max Min  Awake 62 84 54  Asleep 60 78 52  EKG Events: N/A

## 2023-09-12 NOTE — Progress Notes (Unsigned)
 Electrophysiology Office Note:   Date:  09/13/2023  ID:  Judith Salazar, DOB 1955-04-17, MRN [id number]  Primary Cardiologist: None Electrophysiologist: Fonda Kitty, MD      History of Present Illness:   Judith Salazar is a 68 y.o. female with h/o palpitations, HTN, OSA who is being seen today for evaluation for loop recorder implant.   Discussed the use of AI scribe software for clinical note transcription with the patient, who gave verbal consent to proceed.  History of Present Illness Judith Salazar is a 68 year old female who presents with palpitations and concerns about atrial fibrillation. She was referred by Judith Pouch, PA for evaluation of potential atrial fibrillation.  She experiences palpitations and an irregular heartbeat. While in [place], she went to ED for this and during a six-hour stay she had a 14-second episode of irregular heart rhythm on telemetry. She was prescribed Eliquis and metoprolol during her travels and has not experienced any further episodes since that time. She was somewhat uncomfortable with discontinuing Eliquis without a confirmed diagnosis of atrial fibrillation.  She has been using an Apple Watch to monitor her heart rate and rhythm, which has not indicated any episodes of atrial fibrillation since her return from [place]. She was not on metoprolol prior to the [facility] episode, and she believes it may be providing benefit by suppressing symptoms.  She describes a constant 'motorized feeling' in her chest, which she notices both at rest and during activity, but it does not prevent her from performing daily activities. She attributes this to a heart murmur. No edema, orthopnea, or exertional symptoms.  Her past medical history includes hypopnea, for which she is planning to use a CPAP machine. She was informed that reducing her abdominal girth might improve her condition, as her hypopnea is primarily positional, occurring when she is on her  back.  Review of systems complete and found to be negative unless listed in HPI.   EP Information / Studies Reviewed:    EKG is ordered today. Personal review as below.  EKG Interpretation Date/Time:  Monday September 13 2023 07:58:38 EDT Ventricular Rate:  68 PR Interval:  158 QRS Duration:  94 QT Interval:  412 QTC Calculation: 438 R Axis:   81  Text Interpretation: Normal sinus rhythm Cannot rule out Anterior infarct (cited on or before 12-Aug-2023) When compared with ECG of 12-Aug-2023 08:18, Criteria for Inferior infarct are no longer Present Confirmed by Kitty Fonda [phone]) on 09/13/2023 8:33:08 AM   Echo 08/2023: 1. Left ventricular ejection fraction, by estimation, is 60 to 65%. The  left ventricle has normal function. The left ventricle has no regional  wall motion abnormalities. Left ventricular diastolic parameters are  indeterminate.   2. Right ventricular systolic function is normal. The right ventricular  size is normal. There is normal pulmonary artery systolic pressure.   3. The mitral valve is degenerative. Mild mitral valve regurgitation. No  evidence of mitral stenosis. Moderate mitral annular calcification.   4. The aortic valve is calcified. Unable to determine aortic valve  morphology due to image quality. Aortic valve regurgitation is not  visualized. Mild aortic valve stenosis.   5. The inferior vena cava is normal in size with greater than 50%  respiratory variability, suggesting right atrial pressure of 3 mmHg.   30d Monitor 04/2023:  HR 46 - 130, average 76 bpm.   No atrial fibrillation detected. Rare supraventricular ectopy. Rare ventricular ectopy. No sustained arrhythmias. No symptom trigger episodes.  Physical Exam:   VS:  BP 118/74 (BP Location: Left Arm, Patient Position: Sitting, Cuff Size: Large)   Pulse 68   Ht 5' 3 (1.6 m)   Wt 242 lb (109.8 kg)   LMP 02/03/2004 Comment: supracx  SpO2 98%   BMI 42.87 kg/m    Wt Readings from  Last 3 Encounters:  09/13/23 242 lb (109.8 kg)  09/06/23 249 lb (112.9 kg)  08/12/23 245 lb 6.4 oz (111.3 kg)     GEN: Well nourished, well developed in no acute distress NECK: No JVD CARDIAC: Regular rate and rhythm, no murmurs, rubs, gallops RESPIRATORY:  Clear to auscultation without rales, wheezing or rhonchi  ABDOMEN: Soft, non-distended EXTREMITIES:  No edema; No deformity   ASSESSMENT AND PLAN:    #Palpitations:  #Atrial tachycardia: Reviewed video recording of her telemetry at [facility] ED.  Appears consistent with burst of atrial tachycardia.  These were overall relatively short. Discussed role of loop recorder and monitoring for potential atrial fibrillation, as this diagnosis has not been confirmed.  Risk and benefits of loop recorder have been discussed.  Patient would like to think about her options.  For now she will continue using an Apple Watch to monitor her heart rate. Continue metoprolol XL 50 mg daily.  #Hypertension At goal today.  Recommend checking blood pressures 1-2 times per week at home and recording the values.  Recommend bringing these recordings to the primary care physician.  #OSA: Educated patient on correlation between untreated sleep apnea and atrial fibrillation. Encourage compliance with CPAP.   Follow up with Dr. Kennyth for loop recorder if patient chooses to do so.  Otherwise as needed.   Signed, Fonda Kennyth, MD

## 2023-09-13 ENCOUNTER — Ambulatory Visit: Attending: Cardiology | Admitting: Cardiology

## 2023-09-13 ENCOUNTER — Encounter: Payer: Self-pay | Admitting: Cardiology

## 2023-09-13 VITALS — BP 118/74 | HR 68 | Ht 63.0 in | Wt 242.0 lb

## 2023-09-13 DIAGNOSIS — I4719 Other supraventricular tachycardia: Secondary | ICD-10-CM

## 2023-09-13 DIAGNOSIS — G4733 Obstructive sleep apnea (adult) (pediatric): Secondary | ICD-10-CM | POA: Diagnosis not present

## 2023-09-13 DIAGNOSIS — R002 Palpitations: Secondary | ICD-10-CM | POA: Diagnosis not present

## 2023-09-13 DIAGNOSIS — I10 Essential (primary) hypertension: Secondary | ICD-10-CM

## 2023-09-13 NOTE — Patient Instructions (Signed)
 Medication Instructions:  Your physician recommends that you continue on your current medications as directed. Please refer to the Current Medication list given to you today.  *If you need a refill on your cardiac medications before your next appointment, please call your pharmacy*   Follow-Up: At [facility], you and your health needs are our priority.  As part of our continuing mission to provide you with exceptional heart care, our providers are all part of one team.  This team includes your primary Cardiologist (physician) and Advanced Practice Providers or APPs (Physician Assistants and Nurse Practitioners) who all work together to provide you with the care you need, when you need it.  Your next appointment:   6 month(s)  Provider:   You will see one of the following Advanced Practice Providers on your designated Care Team:   Charlies Arthur, PA-C Michael Andy Tillery, PA-C Suzann Riddle, NP Daphne Barrack, NP

## 2023-09-14 ENCOUNTER — Telehealth: Payer: Self-pay | Admitting: *Deleted

## 2023-09-14 NOTE — Telephone Encounter (Signed)
 Call placed to patient to schedule appointment with Dr Ladona.  Left message to call office

## 2023-09-16 NOTE — Telephone Encounter (Signed)
 I spoke with patient and scheduled her to see Dr Ladona on September 8 at 11 AM

## 2023-09-17 DIAGNOSIS — G4733 Obstructive sleep apnea (adult) (pediatric): Secondary | ICD-10-CM | POA: Diagnosis not present

## 2023-09-21 ENCOUNTER — Telehealth: Payer: Self-pay | Admitting: Neurology

## 2023-09-21 NOTE — Telephone Encounter (Signed)
 Pt was scheduled for her initial CPAP on (10/28/23) DME in pt's SnapShot. and between dates are 10/19/23-12/19/23

## 2023-09-21 NOTE — Telephone Encounter (Signed)
 Noted

## 2023-10-11 ENCOUNTER — Encounter: Payer: Self-pay | Admitting: Cardiology

## 2023-10-11 ENCOUNTER — Ambulatory Visit: Attending: Cardiology | Admitting: Cardiology

## 2023-10-11 VITALS — BP 125/83 | HR 63 | Resp 16 | Ht 63.0 in | Wt 246.8 lb

## 2023-10-11 DIAGNOSIS — R0989 Other specified symptoms and signs involving the circulatory and respiratory systems: Secondary | ICD-10-CM

## 2023-10-11 DIAGNOSIS — G4733 Obstructive sleep apnea (adult) (pediatric): Secondary | ICD-10-CM

## 2023-10-11 DIAGNOSIS — R072 Precordial pain: Secondary | ICD-10-CM | POA: Diagnosis not present

## 2023-10-11 DIAGNOSIS — R002 Palpitations: Secondary | ICD-10-CM | POA: Diagnosis not present

## 2023-10-11 DIAGNOSIS — E782 Mixed hyperlipidemia: Secondary | ICD-10-CM

## 2023-10-11 NOTE — Progress Notes (Signed)
 Cardiology Office Note:  .   Date:  10/11/2023  ID:  Judith Salazar, DOB [birth year], MRN [id number] PCP: Nichole Senior, MD  [facility] Providers Cardiologist:  Gordy Bergamo, MD Electrophysiologist:  Fonda Kitty, MD   History of Present Illness: .   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  In view of her risk factors, she is concerned about atrial fibrillation.  She was seen in the ED while traveling to [place] on 06/13/2023 and found to have a brief episode of fast rhythm on telemetry, was started on Eliquis and discharged home.  She was evaluated by EP Dr. Fonda Kitty on 09/13/2023 who evaluated a rhythm strip and felt it was brief atrial tachycardia and no atrial fibrillation and recommended to consider loop recorder implantation.     In the interim she has had an echocardiogram on 08/30/2023 revealing moderate mitral annular calcification, mild calcification of the aortic valve with mild aortic stenosis with a mean gradient of 11 mmHg and valve area 1.36 cm.  Prior to loop plantation, she wanted to have discussions regarding her cardiovascular risk.  She also endorses episodes of chest discomfort when she walks to her car and feels like she can hear or almost feel her aortic stenotic murmur.  She is concerned about all the diagnostic testing that she has gone through and wanted to further discuss.  Patient states that since being on metoprolol, her symptoms of palpitations have essentially resolved.  She presently has a smart watch that records EKG and has not had any further episodes of PACs or PVCs or atrial tachycardia.  Cardiac Studies relevent.    Sleep study September 05, 2022: Severe sleep apnea, AHI 33.2/h. Nocturnal  hypoxemia with oxygen saturations as low as 69% with moderate to severe snoring. No arrhythmias were detected.  Echocardiogram 08/30/2023:   1. Left ventricular ejection fraction, by estimation, is 60 to 65%. The left ventricle has normal function. The left ventricle has no regional wall motion abnormalities. Left ventricular diastolic parameters are indeterminate. 2. Right ventricular systolic function is normal. The right ventricular size is normal. There is normal pulmonary artery systolic pressure. 3. The mitral valve is degenerative. Mild mitral valve regurgitation. No evidence of mitral stenosis. Moderate mitral annular calcification. 4. The aortic valve is calcified. Unable to determine aortic valve morphology due to image quality. Aortic valve regurgitation is not visualized. Mild aortic valve stenosis. 5. No significant change in the study since 09/15/2021.  ED VISIT 06/13/2023   Extended EKG monitoring 30 days starting 03/21/2023: Occasional PACs and PVCs.  No other significant arrhythmias.     Discussed the use of AI scribe software for clinical note transcription with the patient, who gave verbal consent to proceed.  History of Present Illness Judith Salazar is a 68 year old female with palpitations and sleep apnea who presents with concerns about palpitations and potential atrial fibrillation. She was referred by Dr. Nichole for further evaluation of her cardiac issues.  She experiences a sensation of 'scraping' and soreness in her chest without pain. An episode of ectopy at work led to a 30-day monitor showing heart rate variations from 46 to 130 bpm, primarily in the early mornings. A sleep study revealed three beats of non-sustained ventricular tachycardia at 6 AM.  In May, she  experienced a heart rate of 130s for about an hour and a half, which was irregular and suspected to be atrial fibrillation. After resting and hydrating, her symptoms resolved. The following morning, she  experienced premature ventricular contractions. An EKG was normal, and labs showed a magnesium level of 1.7. A CT angiogram ruled out pulmonary embolism. She was placed on Eliquis and metoprolol, with no further issues since.  She uses a CPAP machine for sleep apnea, improving her apnea-hypopnea index from 30 to 0.5-0.7. She reports no daytime somnolence and improved sleep quality.  She has aortic stenosis and mitral annular calcification, with stable aortic valve gradients since August 2023. A CT scan showed coronary atherosclerosis and calcification of the mitral annulus and aortic valve. Statin therapy has reduced her LDL from 211 to 101 on a low dose.  She experiences a sensation of 'grinding' in her chest, described as a constant motor-like feeling, more noticeable at rest. No chest pain or shortness of breath during physical activity, such as walking several miles.   Labs  Care everywhere/Faxed External Labs:  Labs 03/12/2023:  Total cholesterol 302, triglycerides 202, HDL 51, LDL 211.  Non-HDL cholesterol 251.  Serum glucose 93 mg, BUN 30, creatinine 0.9, eGFR 62.5 mL, potassium 3.9, LFTs normal.  Hb 14.4/HCT 42.8, platelets 294, normal indicis.  TSH normal at 2.67.  ROS  Review of Systems  Cardiovascular:  Positive for chest pain. Negative for leg swelling, palpitations and syncope.   Physical Exam:   VS:  BP 125/83 (BP Location: Left Arm, Patient Position: Sitting, Cuff Size: Large)   Pulse 63   Resp 16   Ht 5' 3 (1.6 m)   Wt 246 lb 12.8 oz (111.9 kg)   LMP 02/03/2004 Comment: supracx  SpO2 92%   BMI 43.72 kg/m    Wt Readings from Last 3 Encounters:  10/11/23 246 lb 12.8 oz (111.9 kg)  09/13/23 242 lb (109.8 kg)  09/06/23 249 lb (112.9 kg)    BP Readings from Last 3 Encounters:  10/11/23 125/83  09/13/23 118/74  09/06/23 (!) 138/92   Physical Exam Neck:     Vascular: Carotid bruit (left) present. No JVD.  Cardiovascular:     Rate and Rhythm: Normal rate and  regular rhythm.     Pulses: Intact distal pulses.     Heart sounds: S1 normal and S2 normal. Murmur heard.     Early systolic murmur is present with a grade of 3/6 at the upper right sternal border radiating to the neck.     No gallop.  Pulmonary:     Effort: Pulmonary effort is normal.     Breath sounds: Normal breath sounds.  Abdominal:     General: Bowel sounds are normal.     Palpations: Abdomen is soft.  Musculoskeletal:     Right lower leg: No edema.     Left lower leg: No edema.    EKG:         ASSESSMENT AND PLAN: .      ICD-10-CM   1. Palpitations  R00.2     2. OSA on CPAP  G47.33     3. Precordial pain  R07.2 MYOCARDIAL PERFUSION IMAGING    Cardiac Stress Test: Informed Consent Details: Physician/Practitioner Attestation; Transcribe to consent form and obtain patient signature    4. Left carotid bruit  R09.89 VAS US  CAROTID    5. Mixed hypercholesterolemia and hypertriglyceridemia  E78.2 Cardiac Stress Test: Informed Consent Details: Physician/Practitioner Attestation; Transcribe to consent form  and obtain patient signature     Assessment & Plan Palpitations and supraventricular tachycardia (SVT)/atrial tachycardia, not atrial fibrillation Episodes of palpitations and tachycardia, possibly atrial tachycardia or SVT, not AFib.  Personally reviewed the EKG monitoring that she had recorded while she was in the ED in [place] and I have uploaded an image. No significant arrhythmias on 30-day event monitor or sleep study. Current management with metoprolol and Eliquis has been effective with no recent episodes. Decision against loop recorder due to effective symptom monitoring and management with CPAP for sleep apnea. - Continue metoprolol - Hold metoprolol the evening before and the day of the stress test - No anticoagulation needed at this time - With regard to loop recorder implantation, as patient is completely asymptomatic and has not had any further episodes of  palpitations, although she is certainly at risk for atrial fibrillation, she is now on therapy for CPAP, 30-day event monitor did not reveal A-fib, also during sleep study, she did not have any significant arrhythmias as well including atrial fibrillation makes me feel that we could certainly wait on loop implantation.  However patient to decide upon this.  Aortic stenosis, mild, with severe mitral annular and aortic valve calcification Mild aortic stenosis with severe mitral annular and aortic valve calcification. No change in aortic valve gradients since last evaluation. Risk for coronary artery disease due to calcifications. Current management focuses on monitoring and medical therapy rather than invasive procedures unless symptoms develop. - Order carotid artery duplex scan for left carotid bruit which could be conducted sound from aortic stenosis - Order exercise nuclear stress test for chest discomfort - Hold metoprolol the evening before and the day of the stress test  Aortic and coronary atherosclerosis (asymptomatic, no known CAD) Asymptomatic aortic and coronary atherosclerosis with no known CAD. Recent CT angiogram of chest done on 06/13/2023 when she presented to [facility] in [place] showed coronary atherosclerosis.  Negative PE.  Chest pain symptoms when she walks, are very atypical. Current management with statin therapy has significantly reduced LDL levels. Invasive procedures are not recommended unless symptoms develop. Emphasized the importance of medical therapy and lifestyle modifications. - Continue statin therapy, super responder to statins with >50% reduction with just 5 mg dose of Crestor, dose has been escalated to 10 mg daily. - Order exercise nuclear stress test - Consider adding Zetia if LDL goals are not met, goal LDL <70. - Aortic stenosis has remained stable from echocardiogram in 2023.  Obstructive sleep apnea, severe, on CPAP with hypoxemia Severe  obstructive sleep apnea managed with CPAP. Significant improvement in apnea-hypopnea index with CPAP use. Hypoxemia during sleep study likely contributed to previous arrhythmias. Continued CPAP use is important to manage sleep apnea and reduce cardiovascular risk. - Continue CPAP therapy  Essential hypertension Hypertension managed with losartan. Current management is effective. - Continue losartan  Mixed hyperlipidemia, on statin therapy Mixed hyperlipidemia managed with statin therapy. Significant reduction in LDL from 211 to 101 with low-dose statin. Potential for further improvement with increased statin dose. Consider adding Zetia if LDL goals are not met. - Continue statin therapy - Consider adding Zetia if LDL goals are not met  Obesity, class 3 (morbid obesity) Class 3 obesity with consideration for weight management strategies. Discussion of potential benefits and side effects of weight loss medications like Zepbound. Emphasized the importance of lifestyle modifications including slow eating and portion control. - Consider starting Zepbound for weight management - Encourage slow eating and portion control  Carotid bruit, evaluation for carotid artery disease Presence of carotid bruit with potential risk for carotid artery disease. Plan to evaluate with carotid artery duplex scan. Importance of evaluating carotid arteries to assess risk and guide management. - Order carotid artery duplex scan  No indication for aspirin, patient has had hematemesis in 2017, probably related to peptic ulcer disease and hence in the absence of known >50% carotid or coronary stenosis, would not recommend aspirin therapy for now.  Follow up: 6 months or sooner if abnormal nuclear stress test or carotid artery duplex.  Signed,  Gordy Bergamo, MD, [facility] 10/11/2023, 2:11 PM [facility] [address] [place], [place] [address] Phone: [phone]. Fax:  [phone]

## 2023-10-11 NOTE — Patient Instructions (Signed)
 Medication Instructions:  No medication changes were made at this visit. Continue current regimen.   *If you need a refill on your cardiac medications before your next appointment, please call your pharmacy*  Lab Work: NONE If you have labs (blood work) drawn today and your tests are completely normal, you will receive your results only by: MyChart Message (if you have MyChart) OR A paper copy in the mail If you have any lab test that is abnormal or we need to change your treatment, we will call you to review the results.  Testing/Procedures: Exercise Nuclear Stress Test Your physician has requested that you have en exercise stress myoview. For further information please visit [email or web address] Please follow instruction sheet, as given.   Carotid Duplex Your physician has requested that you have a carotid duplex. This test is an ultrasound of the carotid arteries in your neck. It looks at blood flow through these arteries that supply the brain with blood. Allow one hour for this exam. There are no restrictions or special instructions.   Follow-Up: At [facility], you and your health needs are our priority.  As part of our continuing mission to provide you with exceptional heart care, our providers are all part of one team.  This team includes your primary Cardiologist (physician) and Advanced Practice Providers or APPs (Physician Assistants and Nurse Practitioners) who all work together to provide you with the care you need, when you need it.  Your next appointment:   6 Months  Provider:   Gordy Bergamo, MD    We recommend signing up for the patient portal called MyChart.  Sign up information is provided on this After Visit Summary.  MyChart is used to connect with patients for Virtual Visits (Telemedicine).  Patients are able to view lab/test results, encounter notes, upcoming appointments, etc.  Non-urgent messages can be sent to your provider as well.   To learn more about  what you can do with MyChart, go to [email or web address].

## 2023-10-18 DIAGNOSIS — G4733 Obstructive sleep apnea (adult) (pediatric): Secondary | ICD-10-CM | POA: Diagnosis not present

## 2023-10-27 ENCOUNTER — Encounter: Payer: Self-pay | Admitting: *Deleted

## 2023-10-28 ENCOUNTER — Telehealth (INDEPENDENT_AMBULATORY_CARE_PROVIDER_SITE_OTHER): Admitting: Adult Health

## 2023-10-28 VITALS — BP 124/72 | Wt 242.7 lb

## 2023-10-28 DIAGNOSIS — G4733 Obstructive sleep apnea (adult) (pediatric): Secondary | ICD-10-CM

## 2023-10-28 NOTE — Progress Notes (Signed)
 PATIENT: Judith Salazar DOB: 1956-01-12  REASON FOR VISIT: follow up HISTORY FROM: patient PRIMARY NEUROLOGIST:   Virtual Visit via Video Note  I connected with Judith Salazar on 10/28/23 at  9:45 AM EDT by a video enabled telemedicine application located remotely at [facility] and verified that I am speaking with the correct person using two identifiers who was located at their own home.   I discussed the limitations of evaluation and management by telemedicine and the availability of in person appointments. The patient expressed understanding and agreed to proceed.   PATIENT: Judith Salazar DOB: 1955/12/18  REASON FOR VISIT: follow up HISTORY FROM: patient  HISTORY OF PRESENT ILLNESS: Today 10/28/23  Judith Salazar is a 68 y.o. female with a history of obstructive sleep apnea on CPAP. Returns today for follow-up.  This is her initial CPAP follow-up.  Overall she states that she is doing relatively well.  She never had symptoms of daytime sleepiness but rather was referred by her cardiologist.  Her download is below.  She did see her primary care who has referred her for a carotid ultrasound due to a left carotid bruit.  She returns today for follow-up       REVIEW OF SYSTEMS: Out of a complete 14 system review of symptoms, the patient complains only of the following symptoms, and all other reviewed systems are negative.  ALLERGIES: No Known Allergies  HOME MEDICATIONS: Outpatient Medications Prior to Visit  Medication Sig Dispense Refill   acetaminophen (TYLENOL) 500 MG tablet Take 500 mg by mouth 2 (two) times daily. Back pain     ELIQUIS 5 MG TABS tablet Take 5 mg by mouth 2 (two) times daily.     esomeprazole (NEXIUM) 20 MG capsule Take 20 mg by mouth daily.     estradiol  (ESTRACE ) 0.1 MG/GM vaginal cream Place 1 Applicatorful vaginally 3 (three) times a week. 42.5 g 1   losartan-hydrochlorothiazide (HYZAAR) 50-12.5 MG tablet Take 1 tablet by  mouth daily.     Magnesium 250 MG TABS Take 1 tablet by mouth daily.     methocarbamol (ROBAXIN) 500 MG tablet Take 500 mg by mouth 4 (four) times daily.     metoprolol succinate (TOPROL-XL) 50 MG 24 hr tablet Take 50 mg by mouth daily.     promethazine (PHENERGAN) 25 MG tablet Take 25 mg by mouth every 4 (four) hours as needed for nausea.     rosuvastatin (CRESTOR) 10 MG tablet Take 10 mg by mouth daily.     sodium fluoride (FLUORISHIELD) 1.1 % GEL dental gel Place 1 Application onto teeth at bedtime.     Vitamin D , Ergocalciferol , (DRISDOL ) 1.25 MG (50000 UNIT) CAPS capsule TAKE 1 CAPSULE ONCE A WEEK. 12 capsule 4   No facility-administered medications prior to visit.    PAST MEDICAL HISTORY: Past Medical History:  Diagnosis Date   Anemia    Cervical fusion syndrome    DDD (degenerative disc disease), cervical    Endometrial hyperplasia with atypia    GI bleed 01/21/2016   History of Clostridium difficile 1995   hospitalized x 3 days   Hx of migraines    Madelung's deformity    right wrist   Shingles 3/10, 11/16   Vitamin D  deficiency     PAST SURGICAL HISTORY: Past Surgical History:  Procedure Laterality Date   ABDOMINAL HYSTERECTOMY  2006   supra cervix   CESAREAN SECTION     DILATION AND CURETTAGE  OF UTERUS     TONSILLECTOMY     WISDOM TOOTH EXTRACTION      FAMILY HISTORY: Family History  Problem Relation Age of Onset   Skin cancer Mother    Thyroid  disease Mother        graves disease-now hypothyroid-treatment with radiation iodine   Dementia Mother        mild   Osteoporosis Mother    Hypertension Father    Heart attack Father        deceased age 47   Diabetes Maternal Uncle    Lung cancer Maternal Uncle        mets-renal cell carcinoma   Congestive Heart Failure Maternal Uncle        CABG   Pancreatic cancer Maternal Grandmother    Congestive Heart Failure Maternal Grandfather    COPD Maternal Grandfather    Prostate cancer Paternal Grandfather     Cancer Daughter     SOCIAL HISTORY: Social History   Socioeconomic History   Marital status: Married    Spouse name: Not on file   Number of children: 1   Years of education: Not on file   Highest education level: Not on file  Occupational History   Occupation: NP-CS, Risk analyst  Tobacco Use   Smoking status: Never   Smokeless tobacco: Never  Vaping Use   Vaping status: Never Used  Substance and Sexual Activity   Alcohol use: Yes    Comment: one monthly   Drug use: No   Sexual activity: Yes    Partners: Male    Birth control/protection: Surgical    Comment: TAH  Other Topics Concern   Not on file  Social History Narrative   Not on file   Social Drivers of Health   Financial Resource Strain: Not on file  Food Insecurity: Not on file  Transportation Needs: Not on file  Physical Activity: Not on file  Stress: Not on file  Social Connections: Not on file  Intimate Partner Violence: Not on file      PHYSICAL EXAM Generalized: Well developed, in no acute distress   Neurological examination  Mentation: Alert oriented to time, place, history taking. Follows all commands speech and language fluent Cranial nerve II-XII: Facial symmetry noted.   DIAGNOSTIC DATA (LABS, IMAGING, TESTING) - I reviewed patient records, labs, notes, testing and imaging myself where available.     ASSESSMENT AND PLAN 68 y.o. year old female  has a past medical history of Anemia, Cervical fusion syndrome, DDD (degenerative disc disease), cervical, Endometrial hyperplasia with atypia, GI bleed (01/21/2016), History of Clostridium difficile (1995), migraines, Madelung's deformity, Shingles (3/10, 11/16), and Vitamin D  deficiency. here with:  OSA on CPAP  CPAP compliance excellent Residual AHI is in normal range Encouraged patient to continue using CPAP nightly and > 4 hours each night F/U in 1 year or sooner if needed      Duwaine Russell, MSN, NP-C 10/28/2023, 9:54  AM [facility] [address], [address] [place], [place] [address] [phone]

## 2023-10-28 NOTE — Patient Instructions (Signed)
 Continue using CPAP nightly and greater than 4 hours each night If your symptoms worsen or you develop new symptoms please let us  know.

## 2023-10-29 DIAGNOSIS — G4733 Obstructive sleep apnea (adult) (pediatric): Secondary | ICD-10-CM | POA: Diagnosis not present

## 2023-11-04 ENCOUNTER — Ambulatory Visit (HOSPITAL_COMMUNITY)
Admission: RE | Admit: 2023-11-04 | Discharge: 2023-11-04 | Disposition: A | Discharge status: Home / Self Care | Source: Ambulatory Visit | Attending: Cardiology | Admitting: Cardiology

## 2023-11-04 DIAGNOSIS — R0989 Other specified symptoms and signs involving the circulatory and respiratory systems: Secondary | ICD-10-CM | POA: Insufficient documentation

## 2023-11-05 ENCOUNTER — Ambulatory Visit: Payer: Self-pay | Admitting: Cardiology

## 2023-11-05 ENCOUNTER — Telehealth (HOSPITAL_COMMUNITY): Payer: Self-pay | Admitting: Cardiology

## 2023-11-05 NOTE — Telephone Encounter (Signed)
 I called to schedule the ordered Myoview and patient did not wish to schedule. Her PCP mentioned that maybe Dr. Ladona would order a cardiac MRI and she will reach out to Dr. Ladona for suggestion. Order will be removed from the [facility] and if patient decides to have she will call me back to reschedule and we will reinstate the order. Thank you.

## 2023-11-05 NOTE — Progress Notes (Signed)
 Carotid artery duplex 11/04/2022: Bilateral ICA minimal intimal thickening. Bilateral ICA tortuous and could be the source of bruit. Antegrade vertebral artery flow bilaterally and normal subclavian flow hemodynamics.  Dear Judith Salazar, Your carotid artery duplex is essentially normal, the carotid bruit that I heard is related to tortuosity of your carotid vessels and no further evaluation is needed.

## 2023-11-08 NOTE — Telephone Encounter (Signed)
 I have given her options for PET stress and also coronary CTA. She will think about this and let me know.

## 2023-11-17 DIAGNOSIS — G4733 Obstructive sleep apnea (adult) (pediatric): Secondary | ICD-10-CM | POA: Diagnosis not present

## 2024-01-03 ENCOUNTER — Other Ambulatory Visit (HOSPITAL_COMMUNITY): Payer: Self-pay | Admitting: Endocrinology

## 2024-01-03 ENCOUNTER — Encounter (HOSPITAL_BASED_OUTPATIENT_CLINIC_OR_DEPARTMENT_OTHER): Payer: Self-pay

## 2024-01-03 ENCOUNTER — Ambulatory Visit (HOSPITAL_BASED_OUTPATIENT_CLINIC_OR_DEPARTMENT_OTHER)
Admission: RE | Admit: 2024-01-03 | Discharge: 2024-01-03 | Disposition: A | Discharge status: Home / Self Care | Source: Ambulatory Visit | Attending: Endocrinology | Admitting: Endocrinology

## 2024-01-03 ENCOUNTER — Telehealth: Payer: Self-pay

## 2024-01-03 DIAGNOSIS — R161 Splenomegaly, not elsewhere classified: Secondary | ICD-10-CM | POA: Diagnosis not present

## 2024-01-03 DIAGNOSIS — R197 Diarrhea, unspecified: Secondary | ICD-10-CM | POA: Insufficient documentation

## 2024-01-03 DIAGNOSIS — R1032 Left lower quadrant pain: Secondary | ICD-10-CM | POA: Diagnosis not present

## 2024-01-03 DIAGNOSIS — Z8719 Personal history of other diseases of the digestive system: Secondary | ICD-10-CM

## 2024-01-03 DIAGNOSIS — K5732 Diverticulitis of large intestine without perforation or abscess without bleeding: Secondary | ICD-10-CM | POA: Diagnosis not present

## 2024-01-03 DIAGNOSIS — K8689 Other specified diseases of pancreas: Secondary | ICD-10-CM | POA: Diagnosis not present

## 2024-01-03 DIAGNOSIS — R162 Hepatomegaly with splenomegaly, not elsewhere classified: Secondary | ICD-10-CM | POA: Diagnosis not present

## 2024-01-03 MED ORDER — IOHEXOL 300 MG/ML  SOLN
100.0000 mL | Freq: Once | INTRAMUSCULAR | Status: AC | PRN
  Administered 2024-01-03: 100 mL via INTRAVENOUS

## 2024-01-03 NOTE — Telephone Encounter (Signed)
 She was referred to you by EP and then she came to see me for another opinion regarding look and by that time she was sent to Neuro by PCP and was on CPAP when I first saw her.  All good.

## 2024-01-03 NOTE — Telephone Encounter (Addendum)
**Note De-Identified Torrance Stockley Obfuscation** Per the [facility] provider portal, this CPAP Titration has been approved from 01/03/2024 - 03/02/2024. Order ID: [id number]  When I called the pt to advise her of this approval and to provide her with the [facility] phone number she advised me that she already has a CPAP machine that was ordered by Dr. Chalice with [facility] and that she advised every [facility] MD that she met that she was already being treated for OSA.  Per the pts request, I have cancelled the CPAP Titration order as it is not needed and I cancelled the PA that I completed in the [facility] provider portal.

## 2024-10-26 ENCOUNTER — Telehealth: Admitting: Adult Health
